# Patient Record
Sex: Male | Born: 1940 | Race: White | Hispanic: No | Marital: Married | Smoking: Former smoker
Health system: Southern US, Community
[De-identification: ages and names within clinical notes are randomized; demographics above are authoritative.]

## PROBLEM LIST (undated history)

## (undated) DIAGNOSIS — I10 Essential (primary) hypertension: Secondary | ICD-10-CM

## (undated) DIAGNOSIS — J8410 Pulmonary fibrosis, unspecified: Secondary | ICD-10-CM

## (undated) DIAGNOSIS — I2510 Atherosclerotic heart disease of native coronary artery without angina pectoris: Secondary | ICD-10-CM

## (undated) DIAGNOSIS — I272 Pulmonary hypertension, unspecified: Secondary | ICD-10-CM

## (undated) DIAGNOSIS — I951 Orthostatic hypotension: Secondary | ICD-10-CM

## (undated) HISTORY — PX: BACK SURGERY: SHX140

## (undated) HISTORY — DX: Atherosclerotic heart disease of native coronary artery without angina pectoris: I25.10

## (undated) HISTORY — PX: CORONARY ANGIOPLASTY WITH STENT PLACEMENT: SHX49

## (undated) HISTORY — DX: Orthostatic hypotension: I95.1

## (undated) HISTORY — DX: Pulmonary hypertension, unspecified: I27.20

## (undated) HISTORY — DX: Essential (primary) hypertension: I10

## (undated) HISTORY — PX: CARDIAC CATHETERIZATION: SHX172

## (undated) HISTORY — DX: Pulmonary fibrosis, unspecified: J84.10

---

## 2000-03-31 ENCOUNTER — Ambulatory Visit (HOSPITAL_COMMUNITY): Admission: RE | Admit: 2000-03-31 | Discharge: 2000-03-31 | Payer: Self-pay | Admitting: Family Medicine

## 2000-04-08 ENCOUNTER — Encounter (HOSPITAL_COMMUNITY): Admission: RE | Admit: 2000-04-08 | Discharge: 2000-07-07 | Payer: Self-pay | Admitting: Family Medicine

## 2000-04-22 ENCOUNTER — Encounter: Payer: Self-pay | Admitting: Family Medicine

## 2000-04-22 ENCOUNTER — Ambulatory Visit (HOSPITAL_COMMUNITY): Admission: RE | Admit: 2000-04-22 | Discharge: 2000-04-22 | Payer: Self-pay | Admitting: Family Medicine

## 2000-07-08 ENCOUNTER — Encounter (HOSPITAL_COMMUNITY): Admission: RE | Admit: 2000-07-08 | Discharge: 2000-10-06 | Payer: Self-pay | Admitting: Family Medicine

## 2002-09-05 ENCOUNTER — Ambulatory Visit (HOSPITAL_COMMUNITY): Admission: RE | Admit: 2002-09-05 | Discharge: 2002-09-05 | Payer: Self-pay | Admitting: Family Medicine

## 2008-02-20 ENCOUNTER — Inpatient Hospital Stay (HOSPITAL_COMMUNITY): Admission: EM | Admit: 2008-02-20 | Discharge: 2008-02-22 | Payer: Self-pay | Admitting: *Deleted

## 2009-11-03 IMAGING — CR DG CHEST 1V PORT
1 series · 1 of 1 positions shown · non-contrast
Comparison: None

CLINICAL DATA: Chest pain.

PORTABLE CHEST - 1 VIEW

[AP]
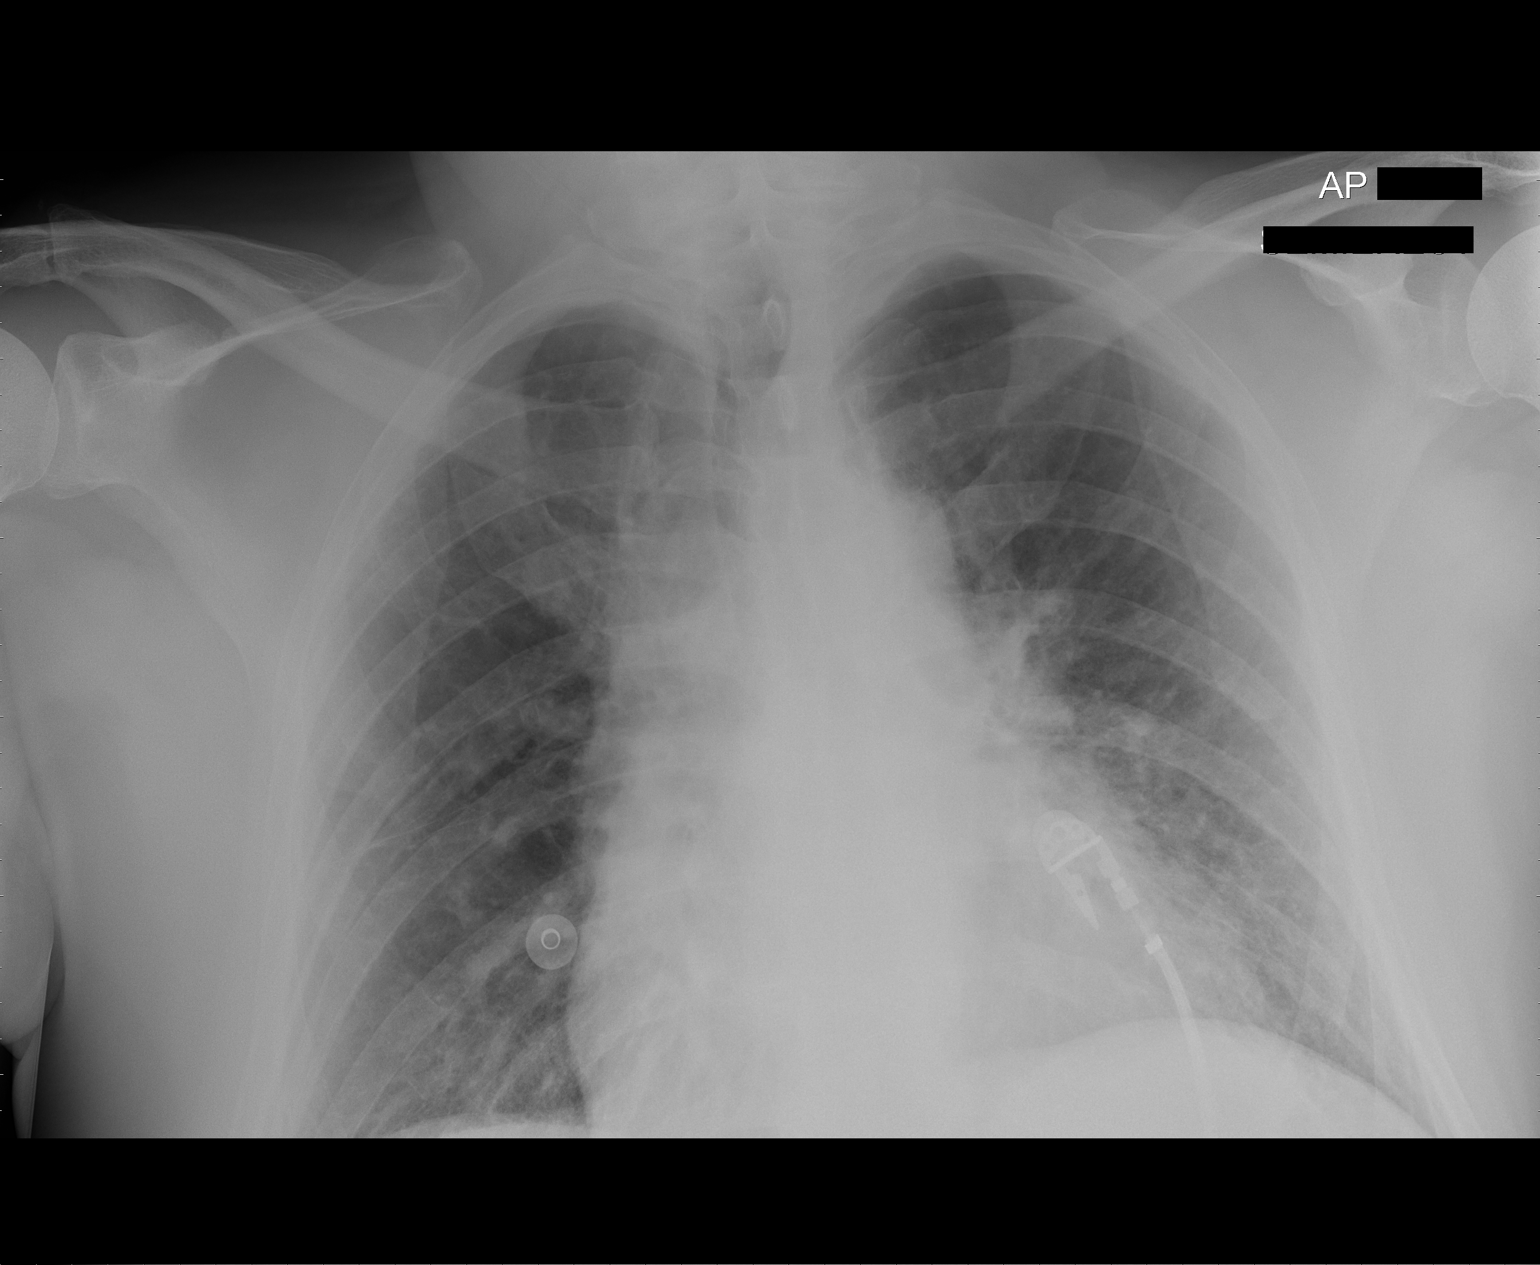

[1 of 1 positions shown; findings below may reference images not displayed]

FINDINGS: Coarsening of interstitial lung markings is seen in the
lung bases bilaterally.  This may be chronic in etiology, although
mild interstitial edema cannot definitely be excluded.  There is no
evidence of pulmonary consolidation or pleural effusion.  Heart
size is at the upper limits of normal.
IMPRESSION: Pulmonary interstitial prominence in the lower lung zones, which
may be due to chronic lung disease although mild interstitial edema
cannot definitely be excluded.

## 2010-01-01 ENCOUNTER — Encounter: Admission: RE | Admit: 2010-01-01 | Discharge: 2010-01-01 | Payer: Self-pay | Admitting: Family Medicine

## 2010-01-20 ENCOUNTER — Ambulatory Visit: Payer: Self-pay | Admitting: Cardiovascular Disease

## 2010-08-04 ENCOUNTER — Ambulatory Visit: Payer: Self-pay | Admitting: Cardiovascular Disease

## 2010-09-24 ENCOUNTER — Ambulatory Visit: Payer: Self-pay | Admitting: Cardiovascular Disease

## 2010-09-29 ENCOUNTER — Encounter: Payer: Self-pay | Admitting: Cardiovascular Disease

## 2010-09-29 DIAGNOSIS — I2510 Atherosclerotic heart disease of native coronary artery without angina pectoris: Secondary | ICD-10-CM | POA: Insufficient documentation

## 2010-09-29 DIAGNOSIS — I951 Orthostatic hypotension: Secondary | ICD-10-CM | POA: Insufficient documentation

## 2010-09-29 DIAGNOSIS — I10 Essential (primary) hypertension: Secondary | ICD-10-CM | POA: Insufficient documentation

## 2010-09-29 DIAGNOSIS — I272 Pulmonary hypertension, unspecified: Secondary | ICD-10-CM | POA: Insufficient documentation

## 2010-09-29 DIAGNOSIS — J8410 Pulmonary fibrosis, unspecified: Secondary | ICD-10-CM | POA: Insufficient documentation

## 2010-10-01 ENCOUNTER — Ambulatory Visit (INDEPENDENT_AMBULATORY_CARE_PROVIDER_SITE_OTHER): Payer: Medicare Other | Admitting: Cardiovascular Disease

## 2010-10-01 ENCOUNTER — Encounter: Payer: Self-pay | Admitting: Cardiovascular Disease

## 2010-10-01 VITALS — BP 138/70 | HR 56 | Ht 71.0 in | Wt 215.4 lb

## 2010-10-01 DIAGNOSIS — E7800 Pure hypercholesterolemia, unspecified: Secondary | ICD-10-CM | POA: Insufficient documentation

## 2010-10-01 DIAGNOSIS — E785 Hyperlipidemia, unspecified: Secondary | ICD-10-CM

## 2010-10-01 DIAGNOSIS — I2510 Atherosclerotic heart disease of native coronary artery without angina pectoris: Secondary | ICD-10-CM

## 2010-10-01 MED ORDER — ROSUVASTATIN CALCIUM 10 MG PO TABS: 10.0000 mg | ORAL_TABLET | Freq: Every day | ORAL | Status: DC

## 2010-10-01 NOTE — Assessment & Plan Note (Signed)
Cody Olson LDL has been little high in the past. We'll go ahead and start him on Crestor 10 mg a day. I've given him a prescription and also a voucher to help with the cost. We will schedule him to have a lipid profile, basic metabolic profile, and hepatic profile in 3 months. Alternatively, he can check these labs at his medical doctor and send [place] the results.  I'll see him again in 6 months.

## 2010-10-01 NOTE — Progress Notes (Signed)
Cody Olson Date of Birth  [birth year] [facility] [address]     [address] [place], [place]  [address] [phone]  Fax  [phone]  History of Present Illness:  Cody Olson is an elderly man with a history of coronary artery disease. He status post PTCA and stenting of his right coronary artery.  He also has a history of hypertension and pulmonary hypertension. He also has Pulmonary fibrosis diagnosed on CT scan.  He's not any episodes of chest pain or shortness of breath.  Current Outpatient Prescriptions on File Prior to Visit  Medication Sig Dispense Refill  . ALPRAZolam (XANAX) 0.5 MG tablet Take 0.5 mg by mouth at bedtime as needed.        Marland Kitchen amLODipine (NORVASC) 5 MG tablet Take 5 mg by mouth daily.        Marland Kitchen aspirin 81 MG tablet Take 81 mg by mouth daily.       . benazepril (LOTENSIN) 20 MG tablet Take 20 mg by mouth daily.        Marland Kitchen buPROPion (WELLBUTRIN SR) 150 MG 12 hr tablet Take 75 mg by mouth 2 (two) times daily.        . clopidogrel (PLAVIX) 75 MG tablet Take 75 mg by mouth daily.        . diclofenac (VOLTAREN) 75 MG EC tablet Take 75 mg by mouth daily.       Marland Kitchen doxazosin (CARDURA) 8 MG tablet Take 8 mg by mouth at bedtime.        . hydrochlorothiazide 25 MG tablet Take 12.5 mg by mouth daily.        . nitroGLYCERIN (NITROSTAT) 0.4 MG SL tablet Place 0.4 mg under the tongue every 5 (five) minutes as needed.        . pantoprazole (PROTONIX) 40 MG tablet Take 40 mg by mouth 2 (two) times daily.        . potassium chloride (KLOR-CON) 10 MEQ CR tablet Take 10 mEq by mouth daily.          No Known Allergies  Past Medical History  Diagnosis Date  . Coronary artery disease     stent 1996, re expansion in 2009  . Hypertension   . Pulmonary hypertension     PA pressure 41 mm Hg  . Pulmonary fibrosis   . Orthostatic hypotension     No past surgical history on file.  History  Smoking status  . Former Smoker  . Quit  date: 06/14/1994  Smokeless tobacco  . Not on file    History  Alcohol Use No    No family history on file.  Reviw of Systems:  Reviewed in the HPI.  All other systems are negative.  Physical Exam: BP 138/70  Pulse 56  Ht 5\' 11"  (1.803 m)  Wt 215 lb 6.4 oz (97.705 kg)  BMI 30.04 kg/m2 The patient is alert and oriented x 3.  The mood and affect are normal.  The skin is warm and dry.  Color is normal.  The HEENT exam reveals that the sclera are nonicteric.  The mucous membranes are moist.  The carotids are 2+ without bruits.  There is no thyromegaly.  There is no JVD.  The lungs are clear.  The chest wall is non tender.  The heart exam reveals a regular rate with a normal S1 and S2.  There is a soft systolic murmur.  The PMI is not displaced.  Abdominal exam reveals good bowel sounds.  There is no guarding or rebound.  There is no hepatosplenomegaly or tenderness.  There are no masses.  Exam of the legs reveal no clubbing, cyanosis, or edema.  The legs are without rashes.  The distal pulses are intact.  Cranial nerves II - XII are intact.  Motor and sensory functions are intact.  The gait is normal.  Assessment / Plan:

## 2010-10-01 NOTE — Assessment & Plan Note (Signed)
He remains asymptomatic.  We will continue with his same medications.

## 2010-10-05 ENCOUNTER — Telehealth: Payer: Self-pay | Admitting: Cardiovascular Disease

## 2010-10-05 NOTE — Telephone Encounter (Signed)
Wanted generic prescription of Lipitor called in to the [facility] in [place] [id number]. I have pulled the chart.

## 2010-10-06 ENCOUNTER — Other Ambulatory Visit: Payer: Self-pay | Admitting: *Deleted

## 2010-10-06 DIAGNOSIS — E785 Hyperlipidemia, unspecified: Secondary | ICD-10-CM

## 2010-10-06 MED ORDER — ATORVASTATIN CALCIUM 40 MG PO TABS: 40.0000 mg | ORAL_TABLET | Freq: Every day | ORAL | Status: DC

## 2010-10-06 NOTE — Telephone Encounter (Signed)
crestor too expensive, request change, lipitor 40mg  escribed, per dr Izell [place] RN

## 2010-10-27 NOTE — H&P (Signed)
NAME:  Cody, Olson NO.:  [id number]   MEDICAL RECORD NO.:  [id number]          PATIENT TYPE:  EMS   LOCATION:  MAJO                         FACILITY:  [facility]   PHYSICIAN:  Vesta Mixer, M.D. DATE OF BIRTH:  Sep 10, 1940   DATE OF ADMISSION:  02/20/2008  DATE OF DISCHARGE:                              HISTORY & PHYSICAL   Cody Olson is a middle-aged gentleman with a history of coronary  artery disease.  He was admitted to the hospital after having episodes  of chest pain.   Cody Olson has a history of CAD.  He is status post PTCA and stenting  of his right coronary artery in 1996.  He has overall done fairly well.  He was scheduled to have a stress test later this year.  This morning,  he awoke with severe upper chest pain.  It was described as a substernal  burning-like sensation.  It lasted about 10-15 minutes.  It was  associated with a profuse diaphoresis as well as bradycardia.  He took a  Nexium, the pain resolved for a few minutes but then returned.  It  eventually resolved on its own.  The patient was brought to the hospital  for further evaluation.   He is not taking any nitroglycerin.   The patient has been relatively active and has not had any other  episodes of chest discomfort.   CURRENT MEDICATIONS:  1. Amlodipine/benazepril 5 mg/20 mg once a day.  2. Bupropion 75 mg tablets - 2 tablets a day.  3. Hydrochlorothiazide 25 mg tablets - one-half tablet a day.  4. Nexium once a day.  5. Cardura 8 mg a day.  6. Xanax 0.5 mg as needed.  7. Aspirin 81 mg a day.  8. Nitroglycerin as needed.   ALLERGIES:  He has no known drug allergies.   PAST MEDICAL HISTORY:  Coronary artery disease.  He is status post PTCA  and stenting of his right coronary artery disease.   SOCIAL HISTORY:  The patient has a history of smoking in the past, but  quit in 1996.   FAMILY HISTORY:  Noncontributory.   REVIEW OF SYSTEMS:  Reviewed and is essentially  negative except as noted  in the HPI.   PHYSICAL EXAMINATION:  GENERAL:  He is a middle-aged gentleman in no  acute distress.  He is alert and oriented x3 and his mood and affect are  normal.  VITAL SIGNS:  His blood pressure is 143/70 with heart rate 60.  HEENT:  Carotids 2+.  He has no bruits, no JVD, and no thyromegaly.  LUNGS:  Clear.  HEART:  Regular rate.  S1 and S2.  ABDOMEN:  Good bowel sounds and is nontender.  EXTREMITIES:  There is no clubbing, cyanosis, or edema.  NEUROLOGIC:  Nonfocal.   His EKG reveals sinus bradycardia.  He has old inferior Q-waves with a  slight amount of ST-segment elevation in the inferior leads.  The EKG is  essentially unchanged from his previous EKGs in 2003.   His point-of-care enzymes are negative thus far.   IMPRESSION AND PLAN:  Chest pain.  The patient's symptoms are very  worrisome for ischemia.  He had profuse diaphoresis as well as  bradycardia.  My recommendation will be to admit him and to schedule  heart catheterization this afternoon.  Other possibilities and possible  etiologies include gastroesophageal reflux pain or musculoskeletal pain.  We will admit him and withdraw further cardiac enzymes.  We will  anticipate doing a heart catheterization today.  We have discussed the  risks, benefits, and options regarding heart catheterization.  He  understands and agrees to proceed.      Vesta Mixer, M.D.  Electronically Signed     PJN/MEDQ  D:  02/20/2008  T:  02/20/2008  Job:  [id number]

## 2010-10-27 NOTE — Cardiovascular Report (Signed)
NAME:  [name], [name] NO.:  [id number]   MEDICAL RECORD NO.:  [id number]          PATIENT TYPE:  INP   LOCATION:  6525                         FACILITY:  [facility]   PHYSICIAN:  Vesta Mixer, M.D. DATE OF BIRTH:  01/08/1941   DATE OF PROCEDURE:  02/21/2008  DATE OF DISCHARGE:                            CARDIAC CATHETERIZATION   Cody Olson is a 70 year old gentleman who was admitted yesterday  with episodes of chest pain.  He had a heart catheterization which  revealed a tight 80-90% proximal right coronary artery stenosis.  The  right coronary artery was relatively small and nondominant and was  thought that perhaps it would not be hemodynamically significant.  His  other vessels only had mild-to-moderate irregularities.   Overnight he bumped his cardiac enzymes and we brought him back to the  lab today for PCI of this vessel.   The procedure was PTCA and cutting balloon procedures to the proximal  right coronary artery.   The right femoral artery was easily cannulated using a modified  Seldinger technique.  Angiomax was given.  The ACT was 336.  A 6-French  Judkins right 4 side-hole guide was positioned into the ostium of the  small nondominant right coronary artery.  The angiogram revealed a  proximal RCA stent.  There was 80-90% in-stent restenosis at the distal  aspect of this stent.   A Prowater guidewire was placed down into the distal right coronary  artery down to one of the RV marginal branches.  A 2.0 x 15-mm apex  balloon was positioned in the proximal vessel for predilatation.  It was  inflated up to 4 atmospheres for 30 seconds.   At this point, a 2.5 x 10-mm cutting balloon was positioned in the right  coronary artery.  It was inflated a total of five times up to a peak  pressure of 8 atmospheres.  Each inflation was approximately 20 seconds.  This gave [place] a very nice angiographic result.  There was a residual  stenosis of about 10%.  The  original size of the stent was 3.0 mm, but  he clearly has a lot of plaque and smooth muscle proliferation within  the stent.   The patient tolerated the procedure quite well.   COMPLICATIONS:  None.   CONCLUSION:  Successful PCI of the proximal RCA in-stent restenosis.  The stenosis was reduced from 80-90% down to 10%.      Vesta Mixer, M.D.  Electronically Signed     PJN/MEDQ  D:  02/21/2008  T:  02/22/2008  Job:  [id number]

## 2010-10-27 NOTE — Cardiovascular Report (Signed)
NAME:  Cody Olson, Cody Olson NO.:  [id number]   MEDICAL RECORD NO.:  [id number]          PATIENT TYPE:  INP   LOCATION:  6525                         FACILITY:  [facility]   PHYSICIAN:  Vesta Mixer, M.D. DATE OF BIRTH:  07-Mar-1941   DATE OF PROCEDURE:  02/20/2008  DATE OF DISCHARGE:                            CARDIAC CATHETERIZATION   Cody Olson is a 70 year old gentleman with a history of coronary  artery disease.  He is status post PTCA and stenting of a relatively  small and nondominant right coronary artery back approximately 10 years  ago.  He presented this morning with some episodes of atypical chest  pain.  He did have some EKG abnormalities, which although were not all  that different from his previous EKG 6 years ago were somewhat  concerning nevertheless.  We decided to proceed with heart  catheterization for further evaluation.   The procedure was left heart catheterization with coronary angiography.  The right femoral artery was easily cannulated using a modified  Seldinger technique.   HEMODYNAMICS:  LV pressure was 151/11 with an aortic pressure of 149/72.   Angiography of left main.  The left main is fairly normal.   The left anterior descending artery is moderate in size.  There was a  minor luminal irregularities in the proximal mid segments between 10%  and 20%.  The LAD gives off several moderate sized diagonal branches,  which are normal.   The left circumflex artery is large and dominant.  The first obtuse  marginal artery is a very high marginal and is fairly small in size.  The second obtuse marginal artery is an extremely large branch.  There  are some minor luminal irregularities in the second OM.   The circumflex continues around and gives off a small posterolateral  branches and then a small, but relatively normal posterior descending  branch.   The right coronary artery is relatively small and nondominant.  There is  a stent in  the proximal RCA.  There is an 80%-90% stenosis within the  stent.  There is good flow down the vessel, but this RCA feeds only a  few RV marginal branches.  There are some vessels that appear to be  collateralizing to the left side.   The left ventriculogram was performed in a 30 RAO position.  It reveals  normal left ventricular systolic function.  The ejection fraction is 65%-  70%.   COMPLICATIONS:  None.   CONCLUSION:  1. Single-vessel coronary artery disease involving the right coronary      artery.  2. Normal left ventricular systolic function.   The patient's right coronary artery is relatively small.  We originally  placed a 3 mm x 15 mm Palmaz-Schatz stent.  The vessel is now only 2 mm  in size and I do not think that we will be able to get it much bigger  than that.  The vessel would have a very high risk of restenosis and  closure.  For now, we will add Plavix and other medications.  We will try to treat him medically.  His  enzymes are negative, so I do  not think that his episodes of chest pain were necessarily cardiac.  We  will continue to follow him, do angioplasty only if he has more pain or  develop some occlusion of this vessel.  He is currently stable leaving  the lab.      Vesta Mixer, M.D.  Electronically Signed     PJN/MEDQ  D:  02/20/2008  T:  02/21/2008  Job:  [id number]

## 2010-10-27 NOTE — Discharge Summary (Signed)
NAME:  Cody Olson, Cody Olson             ACCOUNT NO.:  [id number]   MEDICAL RECORD NO.:  [id number]          PATIENT TYPE:  INP   LOCATION:  6525                         FACILITY:  [facility]   PHYSICIAN:  Vesta Mixer, M.D. DATE OF BIRTH:  July 13, 1940   DATE OF ADMISSION:  02/20/2008  DATE OF DISCHARGE:  02/22/2008                               DISCHARGE SUMMARY   DISCHARGE DIAGNOSES:  1. Coronary artery disease - status post percutaneous transluminal      coronary angioplasty of his right coronary artery stent.  2. Hypertension.   DISCHARGE MEDICATIONS:  1. Aspirin 81 mg a day.  2. Plavix 75 mg a day.  3. Lotrel 5 mg/20 mg once a day.  4. Cardura 8 mg a day.  5. Hydrochlorothiazide 25 mg tablets - one-half tablet daily.  6. Nitroglycerin 0.4 mg as needed.  7. Xanax as needed.  8. Bupropion 75 mg - 2 tablets a day.  9. Nexium 40 mg a day as needed.  10.Potassium chloride 10 mEq a day.   DISPOSITION:  The patient will see Dr. Elease Hashimoto in 1-2 weeks for followup  visit.  He is to see his medical doctor for further issues.   HISTORY:  Cody Olson is a 70 year old gentleman who presented to the  emergency room on February 20, 2008, with some episodes of chest pain.  His initial cardiac enzymes were negative.  He is admitted for further  evaluation.  Please see dictated H&P for further details.   HOSPITAL COURSE PER PROBLEMS:  Chest pain.  The patient was admitted to  hospital.  He had a heart catheterization later that day.  He was found  to have a tight stenosis in a very small and nondominant right coronary  artery.  It was thought that perhaps this lesion could be treated  medically.  We kept him overnight, one additional night, just to collect  cardiac enzymes and cardiac enzymes came back abnormal with a troponin  to 0.31.  Given that information, we proceeded with angioplasty using a  cutting balloon to the previously placed RCA stent.  We obtained a very  nice result and reduced  the 80-90% RCA stenosis down to 10%.  The  patient did not have any further episodes of chest pain and did not have  any complications.   The left circumflex artery and the left anterior descending artery have  mild luminal irregularities, but no critical stenosis.   The patient will be discharged on the above-noted medications and  disposition.  The right coronary artery does supply his SA node and some  RV marginal branches.  His heart rate increased some after we open up  the RCA.  We will see him back in the office in a week or two for  followup visit.      Vesta Mixer, M.D.  Electronically Signed     PJN/MEDQ  D:  02/22/2008  T:  02/22/2008  Job:  [id number]   cc:   [facility]

## 2011-03-17 LAB — BASIC METABOLIC PANEL
BUN: 14
BUN: 7
CO2: 26
Calcium: 8.6
Chloride: 107
Creatinine, Ser: 1.12
Creatinine, Ser: 1.14
GFR calc non Af Amer: >60
Potassium: 3.4 — ABNORMAL LOW

## 2011-03-17 LAB — CARDIAC PANEL(CRET KIN+CKTOT+MB+TROPI)
CK, MB: 2.6
Relative Index: 2.7 — ABNORMAL HIGH
Relative Index: 3.8 — ABNORMAL HIGH
Relative Index: INVALID
Total CK: 97
Troponin I: 0.28 — ABNORMAL HIGH
Troponin I: 0.35 — ABNORMAL HIGH

## 2011-03-17 LAB — CBC
HCT: 40
Hemoglobin: 14
MCHC: 34.6
MCV: 95.7
MCV: 97.2
Platelets: 210
RDW: 12.4
WBC: 9.2

## 2011-03-17 LAB — POCT I-STAT, CHEM 8
Calcium, Ion: 1.13
Creatinine, Ser: 1.3
Hemoglobin: 13.3
Sodium: 140
TCO2: 25

## 2011-03-17 LAB — POCT CARDIAC MARKERS
CKMB, poc: <1 — ABNORMAL LOW
Troponin i, poc: <0.05

## 2011-10-25 ENCOUNTER — Ambulatory Visit: Payer: Medicare Other | Admitting: Cardiovascular Disease

## 2012-01-24 ENCOUNTER — Encounter: Payer: Self-pay | Admitting: *Deleted

## 2012-01-25 ENCOUNTER — Encounter: Payer: Self-pay | Admitting: Cardiovascular Disease

## 2012-01-25 ENCOUNTER — Ambulatory Visit (INDEPENDENT_AMBULATORY_CARE_PROVIDER_SITE_OTHER): Payer: Medicare Other | Admitting: Cardiovascular Disease

## 2012-01-25 VITALS — BP 135/71 | HR 51 | Ht 71.0 in | Wt 210.1 lb

## 2012-01-25 DIAGNOSIS — I2510 Atherosclerotic heart disease of native coronary artery without angina pectoris: Secondary | ICD-10-CM

## 2012-01-25 NOTE — Progress Notes (Signed)
Cody Olson Date of Birth  Feb 01, 1941       [facility]    [place] [address], [address]  [address] [place], [place]  [address]   [place], [place]  [address] [phone]     [phone]   Fax  [phone]    Fax [phone]  Problem List: 1. Coronary artery disease-status post PTCA and stenting of his right coronary artery 2. Hypertension 3. Pulmonary hypertension 4. Pulmonary fibrosis on CT scan 5. Hyperlipidemia 6. Hemochromatosis  History of Present Illness: Cody Olson is an elderly man with a history of coronary artery disease. He status post PTCA and stenting of his right coronary artery. He also has a history of hypertension and pulmonary hypertension. He also has Pulmonary fibrosis diagnosed on CT scan.  He's done well since I last saw him. He's not had any episodes of chest pain or shortness of breath. He has remained very active.    Current Outpatient Prescriptions on File Prior to Visit  Medication Sig Dispense Refill  . ALPRAZolam (XANAX) 0.5 MG tablet Take 0.5 mg by mouth at bedtime as needed.        Marland Kitchen amLODipine (NORVASC) 5 MG tablet Take 5 mg by mouth daily.        Marland Kitchen aspirin 81 MG tablet Take 81 mg by mouth daily.       . benazepril (LOTENSIN) 20 MG tablet Take 20 mg by mouth daily.        Marland Kitchen buPROPion (WELLBUTRIN SR) 150 MG 12 hr tablet Take 75 mg by mouth 2 (two) times daily.        . clopidogrel (PLAVIX) 75 MG tablet Take 75 mg by mouth daily.        . diclofenac (VOLTAREN) 75 MG EC tablet Take 75 mg by mouth 2 (two) times daily.       Marland Kitchen doxazosin (CARDURA) 8 MG tablet Take 8 mg by mouth at bedtime.        . hydrochlorothiazide 25 MG tablet Take 12.5 mg by mouth daily.        . nitroGLYCERIN (NITROSTAT) 0.4 MG SL tablet Place 0.4 mg under the tongue every 5 (five) minutes as needed.        . pantoprazole (PROTONIX) 40 MG tablet Take 40 mg by mouth 2 (two) times daily.        . potassium chloride (KLOR-CON) 10  MEQ CR tablet Take 10 mEq by mouth daily.        Marland Kitchen atorvastatin (LIPITOR) 40 MG tablet Take 1 tablet (40 mg total) by mouth daily.  30 tablet  11    No Known Allergies  Past Medical History  Diagnosis Date  . Coronary artery disease     stent 1996, re expansion in 2009  . Hypertension   . Pulmonary hypertension     PA pressure 41 mm Hg  . Pulmonary fibrosis   . Orthostatic hypotension     Past Surgical History  Procedure Date  . Coronary angioplasty with stent placement     stenting of RCA  . Cardiac catheterization 02/19/2010    Est. EF of 65-70% Single-vessel coronary artery disease involving the RCA -- Normal LV systolic function  . Coronary angioplasty with stent placement 02/20/2010    Successful PCI of the proximal RCA in-stent restenosis -- The stenosis was reduced from 80-90% down to 10%    History  Smoking status  . Former Smoker  . Quit  date: 06/14/1994  Smokeless tobacco  . Not on file    History  Alcohol Use No    No family history on file.  Reviw of Systems:  Reviewed in the HPI.  All other systems are negative.  Physical Exam: Blood pressure 135/71, pulse 51, height 5\' 11"  (1.803 m), weight 210 lb 1.9 oz (95.31 kg). General: Well developed, well nourished, in no acute distress.  Head: Normocephalic, atraumatic, sclera non-icteric, mucus membranes are moist,   Neck: Supple. Carotids are 2 + without bruits. No JVD  Lungs: few basilar rales in the bases.  Heart: regular rate.  normal  S1 S2.  There is a 2/6 systolic murmur at the LSB.  Abdomen: Soft, non-tender, non-distended with normal bowel sounds. No hepatomegaly. No rebound/guarding. No masses.  Msk:  Strength and tone are normal  Extremities: No clubbing or cyanosis. No edema.  Distal pedal pulses are 2+ and equal bilaterally.  Neuro: Alert and oriented X 3. Moves all extremities spontaneously.  Psych:  Responds to questions appropriately with a normal affect.  ECG: 01/25/2012-sinus  bradycardia at 51 beats a minute. He has moderate voltage criteria for left ventricular hypertrophy  Assessment / Plan:

## 2012-01-25 NOTE — Assessment & Plan Note (Signed)
He's done well. His not having any episodes of chest pain.

## 2012-01-25 NOTE — Patient Instructions (Addendum)
Your physician wants you to follow-up in: 1 year  You will receive a reminder letter in the mail two months in advance. If you don't receive a letter, please call our office to schedule the follow-up appointment.   Your physician recommends that you return for a FASTING lipid profile: 1 year   

## 2012-03-07 ENCOUNTER — Institutional Professional Consult (permissible substitution): Payer: Medicare Other | Admitting: Internal Medicine

## 2012-03-17 ENCOUNTER — Encounter: Payer: Self-pay | Admitting: Pulmonary Disease

## 2012-03-17 ENCOUNTER — Ambulatory Visit (INDEPENDENT_AMBULATORY_CARE_PROVIDER_SITE_OTHER): Payer: Medicare Other | Admitting: Pulmonary Disease

## 2012-03-17 ENCOUNTER — Ambulatory Visit (INDEPENDENT_AMBULATORY_CARE_PROVIDER_SITE_OTHER)
Admission: RE | Admit: 2012-03-17 | Discharge: 2012-03-17 | Disposition: A | Discharge status: Home / Self Care | Payer: Medicare Other | Source: Ambulatory Visit | Attending: Pulmonary Disease | Admitting: Pulmonary Disease

## 2012-03-17 VITALS — BP 118/64 | HR 53

## 2012-03-17 DIAGNOSIS — J8410 Pulmonary fibrosis, unspecified: Secondary | ICD-10-CM

## 2012-03-17 NOTE — Progress Notes (Signed)
Subjective:    Patient ID: Cody Olson, male    DOB: [birth year], 71 y.o.   MRN: [id number]  HPI PCP - fred Andrey Campanile   71 y.o ex-smoker, quit '96 presents for evaluation of cough since 7/13. His wife is a Engineer, civil (consulting) & provides part of the history. He is  status post PTCA and stenting of his right coronary artery. He also has a history of hypertension and pulmonary hypertension.  Pulmonary fibrosis was diagnosed on CT scan 7/11 that showed peripheral densities in the lungs bilaterally compatible with early fibrosis/honeycombing. He reports cough & LRI symptoms in 7/13, initially given z-pak , then augmentin & a 6 day prednisone course for persistent symptoms. He feels that the cough is emanating from his throat, minimal clear phlegm worsens with eating, better with prednisone, sleep, robitussin & cough drops. Benazepril was stopped for 10 days, but he felt bad with losartan & hence benazepril restarted.  CXR 8/1  He was diagnosed with hemachromatosis in 2005 & required phlebotomy once, recent ferritin was nml. Pneumovax 2009 He worked as a Designer, industrial/product prior to retirement, smoked 2 PPD x 25 y before quitting in '96 CT chest 10/13 showed peripheral interstitial prominence and scattered ground-glass  opacities compatible with early fibrosis, most pronounced in the mid and lower lung zones, but also involving the upper lung zones O2 satn dropped to 90% on walking & recovered on resting Pulmonary htn mentioned in problem list, no echo report available    Past Medical History  Diagnosis Date  . Coronary artery disease     stent 1996, re expansion in 2009  . Hypertension   . Pulmonary hypertension     PA pressure 41 mm Hg  . Pulmonary fibrosis   . Orthostatic hypotension      Past Surgical History  Procedure Date  . Coronary angioplasty with stent placement     stenting of RCA  . Cardiac catheterization 02/19/2010    Est. EF of 65-70% Single-vessel coronary artery disease involving  the RCA -- Normal LV systolic function  . Coronary angioplasty with stent placement 02/20/2010    Successful PCI of the proximal RCA in-stent restenosis -- The stenosis was reduced from 80-90% down to 10%  . Back surgery     x 3   No Known Allergies  History   Social History  . Marital Status: Married    Spouse Name: N/A    Number of Children: N/A  . Years of Education: N/A   Occupational History  . Not on file.   Social History Main Topics  . Smoking status: Former Smoker -- 2.0 packs/day for 25 years    Types: Cigarettes    Quit date: 06/03/1995  . Smokeless tobacco: Not on file  . Alcohol Use: No  . Drug Use: No  . Sexually Active: Not on file   Other Topics Concern  . Not on file   Social History Narrative  . No narrative on file      Review of Systems  Constitutional: Negative for fever, appetite change and unexpected weight change.  HENT: Negative for ear pain, congestion, sore throat, sneezing, trouble swallowing and dental problem.   Respiratory: Positive for cough. Negative for shortness of breath.   Cardiovascular: Negative for chest pain, palpitations and leg swelling.  Gastrointestinal: Negative for abdominal pain.  Musculoskeletal: Negative for joint swelling.  Skin: Negative for rash.  Neurological: Negative for headaches.  Psychiatric/Behavioral: Negative for dysphoric mood. The patient is not nervous/anxious.  Objective:   Physical Exam  Gen. Pleasant, well-nourished, in no distress, normal affect ENT - no lesions, no post nasal drip Neck: No JVD, no thyromegaly, no carotid bruits Lungs: no use of accessory muscles, no dullness to percussion, bibasal rales, no rhonchi  Cardiovascular: Rhythm regular, heart sounds  normal, no murmurs or gallops, no peripheral edema Abdomen: soft and non-tender, no hepatosplenomegaly, BS normal. Musculoskeletal: No deformities, no cyanosis or clubbing Neuro:  alert, non focal        Assessment &  Plan:

## 2012-03-17 NOTE — Patient Instructions (Addendum)
Stop benazepril Take Benicar 40 mg - 1/2 tab instead - call us for side effects Ambulatory satn Ct scan of lungs for fibrosis & breathing test prior to next visit Take DELSYM 2 tsp thrice daily as needed for cough

## 2012-03-20 ENCOUNTER — Telehealth: Payer: Self-pay | Admitting: Pulmonary Disease

## 2012-03-20 NOTE — Telephone Encounter (Signed)
Called and spoke with patients wife, she stated that Dr. Vassie Loll "promised" her a phone call today with results on CT scan.  I informed patient that Dr. Vassie Loll was not in office today nor tomorrow and at this time it did not appear that he had resulted the CT scan.  Patient verbalized understanding of this, I told her we would call her as soon as these results were available for her.  Nothing further needed at this time.  Will send to [facility] for follow up.

## 2012-03-21 NOTE — Telephone Encounter (Signed)
CT shows fibrosis / scar tissue- slight increased from last scan Await breathing test results- will discuss on next visit

## 2012-03-21 NOTE — Telephone Encounter (Signed)
lmomtcb x1 for pt 

## 2012-03-22 NOTE — Telephone Encounter (Signed)
Spoke with Lew Dawes and she is aware of results and will let patient know; also PFT to be done on 04-19-12 at 9am and will follow up with RA at 10am;results will be given that day.

## 2012-03-22 NOTE — Assessment & Plan Note (Signed)
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

## 2012-04-03 ENCOUNTER — Institutional Professional Consult (permissible substitution): Payer: Medicare Other | Admitting: Pulmonary Disease

## 2012-04-19 ENCOUNTER — Ambulatory Visit: Payer: Medicare Other | Admitting: Pulmonary Disease

## 2012-05-02 ENCOUNTER — Ambulatory Visit (INDEPENDENT_AMBULATORY_CARE_PROVIDER_SITE_OTHER): Payer: Medicare Other | Admitting: Pulmonary Disease

## 2012-05-02 DIAGNOSIS — J8410 Pulmonary fibrosis, unspecified: Secondary | ICD-10-CM

## 2012-05-02 DIAGNOSIS — I272 Pulmonary hypertension, unspecified: Secondary | ICD-10-CM

## 2012-05-02 DIAGNOSIS — I2789 Other specified pulmonary heart diseases: Secondary | ICD-10-CM

## 2012-05-02 LAB — PULMONARY FUNCTION TEST

## 2012-05-02 NOTE — Progress Notes (Signed)
PFT done today. 

## 2012-05-10 ENCOUNTER — Telehealth: Payer: Self-pay | Admitting: Pulmonary Disease

## 2012-05-10 MED ORDER — OLMESARTAN MEDOXOMIL 20 MG PO TABS: 20.0000 mg | ORAL_TABLET | Freq: Every day | ORAL | Status: DC

## 2012-05-10 NOTE — Telephone Encounter (Signed)
I spoke with the pt spouse and she states they did get a call with CT results. She states she just wanted to see if the pt could be seen sooner then 06-01-12. I advised Dr. Vassie Loll did not have any openings but I could send him a message to see if they could be worked in. She states she would rather keep appt they have. She states the pt is not any worse, he is just still having a lot of cough. She states she will call to see if there are any cancellations. She did ask that we leave some samples at front of benicar for the pt, RA started him on this at last OV. Pt was given Benicar 40mg  and advised to take half tablet, but we only had 20mg  samples. Pt is aware to take a whole tablet of the sample meds. ,Carron Curie, CMA

## 2012-05-17 ENCOUNTER — Encounter: Payer: Self-pay | Admitting: Pulmonary Disease

## 2012-05-17 ENCOUNTER — Other Ambulatory Visit (INDEPENDENT_AMBULATORY_CARE_PROVIDER_SITE_OTHER): Payer: Medicare Other

## 2012-05-17 ENCOUNTER — Ambulatory Visit (INDEPENDENT_AMBULATORY_CARE_PROVIDER_SITE_OTHER): Payer: Medicare Other | Admitting: Pulmonary Disease

## 2012-05-17 VITALS — BP 152/70 | HR 55 | Temp 96.8°F | Ht 72.0 in | Wt 220.0 lb

## 2012-05-17 DIAGNOSIS — J8410 Pulmonary fibrosis, unspecified: Secondary | ICD-10-CM

## 2012-05-17 DIAGNOSIS — I10 Essential (primary) hypertension: Secondary | ICD-10-CM

## 2012-05-17 MED ORDER — OLMESARTAN MEDOXOMIL 20 MG PO TABS: 20.0000 mg | ORAL_TABLET | Freq: Every day | ORAL | Status: DC

## 2012-05-17 MED ORDER — TIOTROPIUM BROMIDE MONOHYDRATE 18 MCG IN CAPS: 18.0000 ug | ORAL_CAPSULE | Freq: Every day | RESPIRATORY_TRACT | Status: DC

## 2012-05-17 NOTE — Progress Notes (Signed)
  Subjective:    Patient ID: Cody Olson, male    DOB: 05/05/1941, 70 y.o.   MRN: [id number]  HPI PCP - fred Andrey Campanile   71 y.o ex-smoker, quit '96 presents for fu of ILD & cough since 7/13. His wife is a Engineer, civil (consulting) & provides part of the history.  He is status post PTCA and stenting of his right coronary artery. He also has a history of hypertension and pulmonary hypertension. Pulmonary fibrosis was diagnosed on CT scan 7/11 that showed peripheral densities in the lungs bilaterally compatible with early fibrosis/honeycombing.  He reports cough & LRI symptoms in 7/13, initially given z-pak , then augmentin & a 6 day prednisone course for persistent symptoms. He feels that the cough is emanating from his throat, minimal clear phlegm worsens with eating, better with prednisone, sleep, robitussin & cough drops.  Benazepril was stopped for 10 days, but he felt bad with losartan & hence benazepril restarted.  CXR 8/1  He was diagnosed with hemachromatosis in 2005 & required phlebotomy once, recent ferritin was nml.  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 breathing has been fine. still coughing up white phlem but is better. no wheezing, chest tx PFTs - fvc 66%, fev1 82%, ratio nml, TLC 58%, DLCO 47% Reviewed imaging in detail Cough 90% improved Bp controlled on benicar 20 although high today  Past Medical History  Diagnosis Date  . Coronary artery disease     stent 1996, re expansion in 2009  . Hypertension   . Pulmonary hypertension     PA  pressure 41 mm Hg  . Pulmonary fibrosis   . Orthostatic hypotension      Review of Systems neg for any significant sore throat, dysphagia, itching, sneezing, nasal congestion or excess/ purulent secretions, fever, chills, sweats, unintended wt loss, pleuritic or exertional cp, hempoptysis, orthopnea pnd or change in chronic leg swelling. Also denies presyncope, palpitations, heartburn, abdominal pain, nausea, vomiting, diarrhea or change in bowel or urinary habits, dysuria,hematuria, rash, arthralgias, visual complaints, headache, numbness weakness or ataxia.     Objective:   Physical Exam        Assessment & Plan:

## 2012-05-17 NOTE — Assessment & Plan Note (Signed)
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 He does not want to pursue biopsy Pfts s/o moderate intraparenchymal restriction Discussed role of transplant  Symptomatic Rx if cough recurs

## 2012-05-17 NOTE — Assessment & Plan Note (Signed)
Ct benicar 20 - refills given

## 2012-05-17 NOTE — Patient Instructions (Addendum)
You have pulmonary  Fibrosis Blood owrk today - RA factor, ESR, ANA, hypersens panel Rx for benicar 20 x 1 month x 3 refills Pulm rehab referral Trial of spiriva

## 2012-05-18 LAB — RHEUMATOID FACTOR: Rhuematoid fact SerPl-aCnc: <10 IU/mL (ref <=14)

## 2012-05-23 LAB — HYPERSENSITIVITY PNUEMONITIS PROFILE

## 2012-05-30 ENCOUNTER — Encounter: Payer: Self-pay | Admitting: Pulmonary Disease

## 2012-06-01 ENCOUNTER — Ambulatory Visit: Payer: Medicare Other | Admitting: Pulmonary Disease

## 2012-11-01 ENCOUNTER — Telehealth: Payer: Self-pay | Admitting: Pulmonary Disease

## 2012-11-01 NOTE — Telephone Encounter (Signed)
Pt states that he will call when he is ready to make an appt.  Pt asked that we stop calling him about our recall.  I discarded pt's recall as requested.  Nothing further needed at this time.  Antionette Fairy

## 2012-11-14 ENCOUNTER — Telehealth (HOSPITAL_COMMUNITY): Payer: Self-pay | Admitting: Cardiac Rehabilitation

## 2012-11-14 NOTE — Telephone Encounter (Signed)
After leaving multiple phone messages with pt to enroll in pulmonary rehab, letter mailed to pt home address.

## 2013-04-16 ENCOUNTER — Encounter: Payer: Self-pay | Admitting: Cardiovascular Disease

## 2013-04-25 ENCOUNTER — Ambulatory Visit (HOSPITAL_COMMUNITY): Payer: Medicare Other | Attending: Cardiovascular Disease | Admitting: Radiology

## 2013-04-25 ENCOUNTER — Encounter: Payer: Self-pay | Admitting: Cardiovascular Disease

## 2013-04-25 ENCOUNTER — Ambulatory Visit (INDEPENDENT_AMBULATORY_CARE_PROVIDER_SITE_OTHER): Payer: Medicare Other | Admitting: Cardiovascular Disease

## 2013-04-25 VITALS — BP 155/73 | HR 53 | Ht 71.0 in | Wt 215.0 lb

## 2013-04-25 DIAGNOSIS — Z87891 Personal history of nicotine dependence: Secondary | ICD-10-CM | POA: Insufficient documentation

## 2013-04-25 DIAGNOSIS — I359 Nonrheumatic aortic valve disorder, unspecified: Secondary | ICD-10-CM | POA: Insufficient documentation

## 2013-04-25 DIAGNOSIS — I10 Essential (primary) hypertension: Secondary | ICD-10-CM | POA: Insufficient documentation

## 2013-04-25 DIAGNOSIS — R0989 Other specified symptoms and signs involving the circulatory and respiratory systems: Secondary | ICD-10-CM

## 2013-04-25 DIAGNOSIS — E785 Hyperlipidemia, unspecified: Secondary | ICD-10-CM | POA: Insufficient documentation

## 2013-04-25 DIAGNOSIS — I2789 Other specified pulmonary heart diseases: Secondary | ICD-10-CM | POA: Insufficient documentation

## 2013-04-25 DIAGNOSIS — I079 Rheumatic tricuspid valve disease, unspecified: Secondary | ICD-10-CM | POA: Insufficient documentation

## 2013-04-25 DIAGNOSIS — R0602 Shortness of breath: Secondary | ICD-10-CM | POA: Insufficient documentation

## 2013-04-25 DIAGNOSIS — I2510 Atherosclerotic heart disease of native coronary artery without angina pectoris: Secondary | ICD-10-CM

## 2013-04-25 DIAGNOSIS — R0609 Other forms of dyspnea: Secondary | ICD-10-CM

## 2013-04-25 MED ORDER — ATORVASTATIN CALCIUM 20 MG PO TABS: 20.0000 mg | ORAL_TABLET | Freq: Every day | ORAL | Status: DC

## 2013-04-25 NOTE — Progress Notes (Signed)
Echocardiogram performed.  

## 2013-04-25 NOTE — Progress Notes (Signed)
   Cody Olson Date of Birth  08/05/1940       [place] Office    [place] [facility] [address], [address]  [address], [address] [place], [place]  [address]   [place], [place]  [address] [phone]     [phone]   Fax  [phone]    Fax [phone]  Problem List: 1. Coronary artery disease-status post PTCA and stenting of his right coronary artery 2. Hypertension 3. Pulmonary hypertension 4. Pulmonary fibrosis on CT scan 5. Hyperlipidemia 6. Hemochromatosis  History of Present Illness: Cody Olson is an elderly man with a history of coronary artery disease. He status post PTCA and stenting of his right coronary artery. He also has a history of hypertension and pulmonary hypertension. He also has Pulmonary fibrosis diagnosed on CT scan.  He's done well since I last saw him. He's not had any episodes of chest pain or shortness of breath. He has remained very active.  Nov. 12, 2014:  Cody Olson is doing well but he continues to have severe dyspnea.  He has pulmonary fibrosis but the pulmonary doctors dont think it is bad enough to be causiing the dypnea.   No angina.  Dyspnea is always with exertion.   The pain does not feel like his previous episodes of angina.  He can walk on a flat road for 30 minutes, but going up hill, he is short of breath in 10 minutes.    He has had PFTs that revealed some mild pulmanry fibrosis but not severe enough to be causing these symptoms.    Current Outpatient Prescriptions on File Prior to Visit  Medication Sig Dispense Refill  . acyclovir (ZOVIRAX) 400 MG tablet Take 400 mg by mouth 5 (five) times daily as needed.      . ALPRAZolam (XANAX) 0.5 MG tablet Take 0.5 mg by mouth at bedtime as needed.        . amLODipine (NORVASC) 5 MG tablet Take 5 mg by mouth daily.        . aspirin 81 MG tablet Take 81 mg by mouth daily.       . buPROPion (WELLBUTRIN SR) 150 MG 12 hr tablet Take 75 mg by mouth 2 (two) times daily.        .  clobetasol ointment (TEMOVATE) 0.05 % Apply 1 application topically as needed.      . clopidogrel (PLAVIX) 75 MG tablet Take 75 mg by mouth daily.        . doxazosin (CARDURA) 8 MG tablet Take 8 mg by mouth at bedtime.        . hydrochlorothiazide 25 MG tablet Take 12.5 mg by mouth daily.        . HYDROcodone-acetaminophen (VICODIN) 5-500 MG per tablet Take 1 tablet by mouth every 6 (six) hours as needed.      . nitroGLYCERIN (NITROSTAT) 0.4 MG SL tablet Place 0.4 mg under the tongue every 5 (five) minutes as needed.        . olmesartan (BENICAR) 20 MG tablet Take 1 tablet (20 mg total) by mouth daily.  30 tablet  3  . pantoprazole (PROTONIX) 40 MG tablet Take 40 mg by mouth 2 (two) times daily.        . potassium chloride (KLOR-CON) 10 MEQ CR tablet Take 10 mEq by mouth daily.        . tiotropium (SPIRIVA) 18 MCG inhalation capsule Place 1 capsule (18 mcg total) into inhaler and inhale daily.    10 capsule  6   No current facility-administered medications on file prior to visit.    No Known Allergies  Past Medical History  Diagnosis Date  . Coronary artery disease     stent 1996, re expansion in 2009  . Hypertension   . Pulmonary hypertension     PA pressure 41 mm Hg  . Pulmonary fibrosis   . Orthostatic hypotension     Past Surgical History  Procedure Laterality Date  . Coronary angioplasty with stent placement      stenting of RCA  . Cardiac catheterization  02/19/2010    Est. EF of 65-70% Single-vessel coronary artery disease involving the RCA -- Normal LV systolic function  . Coronary angioplasty with stent placement  02/20/2010    Successful PCI of the proximal RCA in-stent restenosis -- The stenosis was reduced from 80-90% down to 10%  . Back surgery      x 3    History  Smoking status  . Former Smoker -- 2.00 packs/day for 25 years  . Types: Cigarettes  . Quit date: 06/03/1995  Smokeless tobacco  . Not on file    History  Alcohol Use No    Family History    Problem Relation Age of Onset  . Heart disease Mother     Reviw of Systems:  Reviewed in the HPI.  All other systems are negative.  Physical Exam: Blood pressure 155/73, pulse 53, height 5' 11" (1.803 m), weight 215 lb (97.523 kg). General: Well developed, well nourished, in no acute distress.  Head: Normocephalic, atraumatic, sclera non-icteric, mucus membranes are moist,   Neck: Supple. Carotids are 2 + without bruits. No JVD  Lungs: few basilar rales in the bases.  Heart: regular rate.  normal  S1 S2.  There is a 2/6 systolic murmur at the LSB.  Abdomen: Soft, non-tender, non-distended with normal bowel sounds. No hepatomegaly. No rebound/guarding. No masses.  Msk:  Strength and tone are normal  Extremities: No clubbing or cyanosis. No edema.  Distal pedal pulses are 2+ and equal bilaterally.  Neuro: Alert and oriented X 3. Moves all extremities spontaneously.  Psych:  Responds to questions appropriately with a normal affect.  ECG: Nov. 12, 2014:  Sinus brady at 53.  Moderate voltage for LVH, previous inf. Mi.  Assessment / Plan:   

## 2013-04-25 NOTE — Patient Instructions (Signed)
Your physician has requested that you have en exercise stress myoview.  Please follow instruction sheet, as given.  Your physician has requested that you have an echocardiogram. Echocardiography is a painless test that uses sound waves to create images of your heart. It provides your doctor with information about the size and shape of your heart and how well your heart's chambers and valves are working. This procedure takes approximately one hour. There are no restrictions for this procedure.  Your physician recommends that you schedule a follow-up appointment in: 3 months with Dr Elease Hashimoto

## 2013-04-25 NOTE — Assessment & Plan Note (Addendum)
Cody Olson is having more dyspnea with exertion - not any significant angina.  i am concerned that this may be an angina equivalent.   Addendum:  The stress myoview was low risk but Cody Olson has continued to have CP - exertional in nature. He thinks his symptoms are similar to his previous episodes of pain and would be in favor of doing a cath.  Will schedule a cath in the next several days.

## 2013-05-04 ENCOUNTER — Encounter: Payer: Self-pay | Admitting: Cardiology

## 2013-05-04 ENCOUNTER — Ambulatory Visit (HOSPITAL_COMMUNITY): Payer: Medicare Other | Attending: Cardiology | Admitting: Radiology

## 2013-05-04 VITALS — BP 143/68 | HR 52 | Ht 71.0 in | Wt 210.0 lb

## 2013-05-04 DIAGNOSIS — R079 Chest pain, unspecified: Secondary | ICD-10-CM | POA: Insufficient documentation

## 2013-05-04 DIAGNOSIS — R0602 Shortness of breath: Secondary | ICD-10-CM

## 2013-05-04 DIAGNOSIS — R0989 Other specified symptoms and signs involving the circulatory and respiratory systems: Secondary | ICD-10-CM | POA: Insufficient documentation

## 2013-05-04 DIAGNOSIS — R0609 Other forms of dyspnea: Secondary | ICD-10-CM | POA: Insufficient documentation

## 2013-05-04 DIAGNOSIS — I2510 Atherosclerotic heart disease of native coronary artery without angina pectoris: Secondary | ICD-10-CM

## 2013-05-04 MED ORDER — TECHNETIUM TC 99M SESTAMIBI GENERIC - CARDIOLITE
33.0000 | Freq: Once | INTRAVENOUS | Status: AC | PRN
  Administered 2013-05-04: 33 via INTRAVENOUS

## 2013-05-04 MED ORDER — REGADENOSON 0.4 MG/5ML IV SOLN
0.4000 mg | Freq: Once | INTRAVENOUS | Status: AC
  Administered 2013-05-04: 0.4 mg via INTRAVENOUS

## 2013-05-04 MED ORDER — TECHNETIUM TC 99M SESTAMIBI GENERIC - CARDIOLITE
11.0000 | Freq: Once | INTRAVENOUS | Status: AC | PRN
  Administered 2013-05-04: 11 via INTRAVENOUS

## 2013-05-04 NOTE — Progress Notes (Signed)
[facility] SITE 3 NUCLEAR MED [address] [place], [place] [address] [phone]    Cardiology Nuclear Med Study  Cody Olson is a 72 y.o. male     MRN : [id number]     DOB: 01/03/1941  Procedure Date: 05/04/2013  Nuclear Med Background Indication for Stress Test:  Evaluation for Ischemia and PTCA/Stent Patency History:  '96 MI, PTCA/Stent RCA,Pulmonary Fibrosis, and 04-25-13 Echo: EF=55-60, mild AS Cardiac Risk Factors: Family History - CAD, History of Smoking, Hypertension and Lipids  Symptoms:  Chest Pain and DOE   Nuclear Pre-Procedure Caffeine/Decaff Intake:  None > 12 hrs NPO After: 12:00am   Lungs:  clear O2 Sat: 94% on room air. IV 0.9% NS with Angio Cath:  20g  IV Site: R Hand x 1, tolerated well IV Started by:  Irean Hong, RN  Chest Size (in):  46 Cup Size: n/a  Height: 5\' 11"  (1.803 m)  Weight:  210 lb (95.255 kg)  BMI:  Body mass index is 29.3 kg/(m^2). Tech Comments:  Held HCTZ today    Nuclear Med Study 1 or 2 day study: 1 day  Stress Test Type:  Lexiscan  Reading MD: Cassell Clement, MD  Order Authorizing Provider:  Kristeen Miss, MD  Resting Radionuclide: Technetium 35m Sestamibi  Resting Radionuclide Dose: 11.0 mCi   Stress Radionuclide:  Technetium 48m Sestamibi  Stress Radionuclide Dose: 33.0 mCi           Stress Protocol Rest HR: 52 Stress HR: 116  Rest BP: 143/68 Stress BP: 189/61  Exercise Time (min): 4:00 METS: 5.10   Predicted Max HR: 149 bpm % Max HR: 77.85 bpm Rate Pressure Product: 21308   Dose of Adenosine (mg):  n/a Dose of Lexiscan: 0.4 mg  Dose of Atropine (mg): n/a Dose of Dobutamine: n/a mcg/kg/min (at max HR)  Stress Test Technologist: Irean Hong, RN  Nuclear Technologist:  Dario Guardian, CNMT     Rest Procedure:  Myocardial perfusion imaging was performed at rest 45 minutes following the intravenous administration of Technetium 11m Sestamibi. Rest ECG: NSR - Normal EKG  Stress Procedure: The patient  attempted to walk the treadmill utilizing the Bruce Protocol for 4:00 , but was unable to reach target heart rate due to marked DOE. The treadmill was stopped. The patients' lung fields were clear. O2 sat was 91% on RA. The patient was given Albuterol inhaler x 2 sprays, and O2 started 2L via nasal cannula with improvement of symptoms.  The patient received IV Lexiscan 0.4 mg over 15-seconds.  Technetium 37m Sestamibi injected at 30-seconds. The patient complained of stomach pain, but denied chest pain. Quantitative spect images were obtained after a 45 minute delay. Stress ECG: No significant change from baseline ECG  QPS Raw Data Images:  Normal; no motion artifact; normal heart/lung ratio. Stress Images:  Normal homogeneous uptake in all areas of the myocardium. Rest Images:  Normal homogeneous uptake in all areas of the myocardium. Subtraction (SDS):  No evidence of ischemia. Transient Ischemic Dilatation (Normal <1.22):  0.98 Lung/Heart Ratio (Normal <0.45):  0.33  Quantitative Gated Spect Images QGS EDV:  104 ml QGS ESV:  41 ml  Impression Exercise Capacity:  Attempted ETT,unable to reach target heart rate,changed to sitting Lexiscan. BP Response:  Normal blood pressure response. Clinical Symptoms:  There is dyspnea. ECG Impression:  No significant ST segment change suggestive of ischemia. Comparison with Prior Nuclear Study: No previous nuclear study performed  Overall Impression:  Low risk stress  nuclear study. No ischemia.  Very poor exercise tolerance limited by dyspnea. Switched from [employer] protocol to [employer].  LV Ejection Fraction: 61%.  LV Wall Motion:  NL LV Function; NL Wall Motion  [employer]  .

## 2013-05-07 ENCOUNTER — Telehealth: Payer: Self-pay | Admitting: Cardiovascular Disease

## 2013-05-07 NOTE — Telephone Encounter (Signed)
Preliminary Results of stress test reviewed, Please advise what the next test was going to be if this test was normal. Please recommend a different pulmonologist.

## 2013-05-07 NOTE — Telephone Encounter (Signed)
Since cp has continued a heart cath was suggested. Wife/ pt informed and will call back tomorrow with when they would like to schedule. Told to go to ED if further cp. Agreed to plan.

## 2013-05-07 NOTE — Telephone Encounter (Signed)
Patient needs you to giver her a call back please.

## 2013-05-07 NOTE — Telephone Encounter (Signed)
New message ° ° ° ° °Want test results °

## 2013-05-07 NOTE — Telephone Encounter (Signed)
Wife wanted Dr to know that pt has had higher bp than usual,180/90.  He has had some night sweats and has had episodes of chest tightness while sitting in chair, did not try nitro- pain went away on its own.  Please advise.

## 2013-05-08 NOTE — Telephone Encounter (Signed)
msg left to call back to see if they wanted to proceed with a LHC, asked them to call back.

## 2013-05-08 NOTE — Telephone Encounter (Signed)
Follow Up  ° °Returned call  °

## 2013-05-08 NOTE — Telephone Encounter (Signed)
Went to his internal med Dr last night.  meds were adjusted. Pt/wife made a f/u app 06/18/12. App to discuss need for LHC. Pt/wife would like to wait and see if bp lowers/ symptoms happen again. Advised to try nitro if sx happens again.  Told to call with any further questions or concerns.  Pt/wife agreed to plan and verbalized understanding.

## 2013-05-14 ENCOUNTER — Encounter: Payer: Self-pay | Admitting: *Deleted

## 2013-05-14 ENCOUNTER — Encounter (HOSPITAL_COMMUNITY): Payer: Self-pay | Admitting: Pharmacy Technician

## 2013-05-14 ENCOUNTER — Telehealth: Payer: Self-pay | Admitting: Cardiovascular Disease

## 2013-05-14 DIAGNOSIS — I2510 Atherosclerotic heart disease of native coronary artery without angina pectoris: Secondary | ICD-10-CM

## 2013-05-14 DIAGNOSIS — R079 Chest pain, unspecified: Secondary | ICD-10-CM

## 2013-05-14 NOTE — Telephone Encounter (Signed)
New message  Patient's wife has a couple of questions for you, If you cant call her today she said tomorrow would be fine. Please call and advise.

## 2013-05-14 NOTE — Telephone Encounter (Signed)
Cath accepted for 05/24/13 with labs on 05/21/13 and will ask to see me for cath letter.

## 2013-05-14 NOTE — Telephone Encounter (Signed)
Wants to proceed with LHC not this week but next week. Pt has had cp per wife/ nitro used once and helped. Advised LHC sooner than later but they prefer next week. They will go to ED with further cp.

## 2013-05-21 ENCOUNTER — Other Ambulatory Visit (INDEPENDENT_AMBULATORY_CARE_PROVIDER_SITE_OTHER): Payer: Medicare Other

## 2013-05-21 DIAGNOSIS — I2510 Atherosclerotic heart disease of native coronary artery without angina pectoris: Secondary | ICD-10-CM

## 2013-05-21 DIAGNOSIS — R079 Chest pain, unspecified: Secondary | ICD-10-CM

## 2013-05-21 LAB — CBC
HCT: 38.5 % — ABNORMAL LOW (ref 39.0–52.0)
Hemoglobin: 13.4 g/dL (ref 13.0–17.0)
MCHC: 34.7 g/dL (ref 30.0–36.0)
MCV: 95.3 fl (ref 78.0–100.0)
Platelets: 256 10*3/uL (ref 150.0–400.0)
RBC: 4.03 Mil/uL — ABNORMAL LOW (ref 4.22–5.81)
RDW: 14 % (ref 11.5–14.6)

## 2013-05-21 LAB — BASIC METABOLIC PANEL
CO2: 23 mEq/L (ref 19–32)
Calcium: 8.8 mg/dL (ref 8.4–10.5)
Chloride: 107 mEq/L (ref 96–112)
GFR: 54.82 mL/min — ABNORMAL LOW (ref >60.00)
Glucose, Bld: 126 mg/dL — ABNORMAL HIGH (ref 70–99)
Potassium: 4.1 mEq/L (ref 3.5–5.1)
Sodium: 138 mEq/L (ref 135–145)

## 2013-05-21 LAB — PROTIME-INR
INR: 1.3 ratio — ABNORMAL HIGH (ref 0.8–1.0)
Prothrombin Time: 13.3 s — ABNORMAL HIGH (ref 10.2–12.4)

## 2013-05-24 ENCOUNTER — Ambulatory Visit (HOSPITAL_COMMUNITY)
Admission: RE | Admit: 2013-05-24 | Discharge: 2013-05-24 | Disposition: A | Discharge status: Home / Self Care | Payer: Medicare Other | Source: Ambulatory Visit | Attending: Cardiovascular Disease | Admitting: Cardiovascular Disease

## 2013-05-24 ENCOUNTER — Encounter (HOSPITAL_COMMUNITY)
Admission: RE | Disposition: A | Discharge status: Home / Self Care | Payer: Self-pay | Source: Ambulatory Visit | Attending: Cardiovascular Disease

## 2013-05-24 DIAGNOSIS — Z7982 Long term (current) use of aspirin: Secondary | ICD-10-CM | POA: Insufficient documentation

## 2013-05-24 DIAGNOSIS — Z87891 Personal history of nicotine dependence: Secondary | ICD-10-CM | POA: Insufficient documentation

## 2013-05-24 DIAGNOSIS — I10 Essential (primary) hypertension: Secondary | ICD-10-CM | POA: Insufficient documentation

## 2013-05-24 DIAGNOSIS — E785 Hyperlipidemia, unspecified: Secondary | ICD-10-CM | POA: Insufficient documentation

## 2013-05-24 DIAGNOSIS — Z7902 Long term (current) use of antithrombotics/antiplatelets: Secondary | ICD-10-CM | POA: Insufficient documentation

## 2013-05-24 DIAGNOSIS — R079 Chest pain, unspecified: Secondary | ICD-10-CM | POA: Insufficient documentation

## 2013-05-24 DIAGNOSIS — I2510 Atherosclerotic heart disease of native coronary artery without angina pectoris: Secondary | ICD-10-CM

## 2013-05-24 DIAGNOSIS — Z9861 Coronary angioplasty status: Secondary | ICD-10-CM | POA: Insufficient documentation

## 2013-05-24 DIAGNOSIS — J8410 Pulmonary fibrosis, unspecified: Secondary | ICD-10-CM | POA: Insufficient documentation

## 2013-05-24 DIAGNOSIS — Z79899 Other long term (current) drug therapy: Secondary | ICD-10-CM | POA: Insufficient documentation

## 2013-05-24 DIAGNOSIS — I2789 Other specified pulmonary heart diseases: Secondary | ICD-10-CM | POA: Insufficient documentation

## 2013-05-24 HISTORY — PX: LEFT HEART CATHETERIZATION WITH CORONARY ANGIOGRAM: SHX5451

## 2013-05-24 SURGERY — LEFT HEART CATHETERIZATION WITH CORONARY ANGIOGRAM
Anesthesia: LOCAL

## 2013-05-24 MED ORDER — SODIUM CHLORIDE 0.9 % IV SOLN: INTRAVENOUS | Status: DC

## 2013-05-24 MED ORDER — HEPARIN (PORCINE) IN NACL 2-0.9 UNIT/ML-% IJ SOLN
INTRAMUSCULAR | Status: AC
  Filled 2013-05-24: qty 1000

## 2013-05-24 MED ORDER — VERAPAMIL HCL 2.5 MG/ML IV SOLN
INTRAVENOUS | Status: AC
  Filled 2013-05-24: qty 2

## 2013-05-24 MED ORDER — LIDOCAINE HCL (PF) 1 % IJ SOLN
INTRAMUSCULAR | Status: AC
  Filled 2013-05-24: qty 30

## 2013-05-24 MED ORDER — FENTANYL CITRATE 0.05 MG/ML IJ SOLN
INTRAMUSCULAR | Status: AC
  Filled 2013-05-24: qty 2

## 2013-05-24 MED ORDER — ACETAMINOPHEN 325 MG PO TABS: 650.0000 mg | ORAL_TABLET | ORAL | Status: DC | PRN

## 2013-05-24 MED ORDER — SODIUM CHLORIDE 0.9 % IV SOLN: 250.0000 mL | INTRAVENOUS | Status: DC | PRN

## 2013-05-24 MED ORDER — SODIUM CHLORIDE 0.9 % IV SOLN
INTRAVENOUS | Status: DC
  Administered 2013-05-24: 10:00:00 via INTRAVENOUS

## 2013-05-24 MED ORDER — ONDANSETRON HCL 4 MG/2ML IJ SOLN: 4.0000 mg | Freq: Four times a day (QID) | INTRAMUSCULAR | Status: DC | PRN

## 2013-05-24 MED ORDER — MIDAZOLAM HCL 2 MG/2ML IJ SOLN
INTRAMUSCULAR | Status: AC
  Filled 2013-05-24: qty 2

## 2013-05-24 MED ORDER — ASPIRIN 81 MG PO CHEW
81.0000 mg | CHEWABLE_TABLET | ORAL | Status: AC
  Administered 2013-05-24: 81 mg via ORAL
  Filled 2013-05-24: qty 1

## 2013-05-24 MED ORDER — SODIUM CHLORIDE 0.9 % IJ SOLN: 3.0000 mL | INTRAMUSCULAR | Status: DC | PRN

## 2013-05-24 MED ORDER — SODIUM CHLORIDE 0.9 % IJ SOLN: 3.0000 mL | Freq: Two times a day (BID) | INTRAMUSCULAR | Status: DC

## 2013-05-24 NOTE — H&P (View-Only) (Signed)
Cody Olson Date of Birth  1940/10/22       [facility]    [place] [address], [address]  [address] [place], [place]  [address]   [place], [place]  [address] [phone]     [phone]   Fax  [phone]    Fax [phone]  Problem List: 1. Coronary artery disease-status post PTCA and stenting of his right coronary artery 2. Hypertension 3. Pulmonary hypertension 4. Pulmonary fibrosis on CT scan 5. Hyperlipidemia 6. Hemochromatosis  History of Present Illness: Cody Olson is an elderly man with a history of coronary artery disease. He status post PTCA and stenting of his right coronary artery. He also has a history of hypertension and pulmonary hypertension. He also has Pulmonary fibrosis diagnosed on CT scan.  He's done well since I last saw him. He's not had any episodes of chest pain or shortness of breath. He has remained very active.  Nov. 12, 2014:  Cody Olson is doing well but he continues to have severe dyspnea.  He has pulmonary fibrosis but the pulmonary doctors dont think it is bad enough to be causiing the dypnea.   No angina.  Dyspnea is always with exertion.   The pain does not feel like his previous episodes of angina.  He can walk on a flat road for 30 minutes, but going up hill, he is short of breath in 10 minutes.    He has had PFTs that revealed some mild pulmanry fibrosis but not severe enough to be causing these symptoms.    Current Outpatient Prescriptions on File Prior to Visit  Medication Sig Dispense Refill  . acyclovir (ZOVIRAX) 400 MG tablet Take 400 mg by mouth 5 (five) times daily as needed.      . ALPRAZolam (XANAX) 0.5 MG tablet Take 0.5 mg by mouth at bedtime as needed.        Marland Kitchen amLODipine (NORVASC) 5 MG tablet Take 5 mg by mouth daily.        Marland Kitchen aspirin 81 MG tablet Take 81 mg by mouth daily.       Marland Kitchen buPROPion (WELLBUTRIN SR) 150 MG 12 hr tablet Take 75 mg by mouth 2 (two) times daily.        .  clobetasol ointment (TEMOVATE) 0.05 % Apply 1 application topically as needed.      . clopidogrel (PLAVIX) 75 MG tablet Take 75 mg by mouth daily.        Marland Kitchen doxazosin (CARDURA) 8 MG tablet Take 8 mg by mouth at bedtime.        . hydrochlorothiazide 25 MG tablet Take 12.5 mg by mouth daily.        Marland Kitchen HYDROcodone-acetaminophen (VICODIN) 5-500 MG per tablet Take 1 tablet by mouth every 6 (six) hours as needed.      . nitroGLYCERIN (NITROSTAT) 0.4 MG SL tablet Place 0.4 mg under the tongue every 5 (five) minutes as needed.        Marland Kitchen olmesartan (BENICAR) 20 MG tablet Take 1 tablet (20 mg total) by mouth daily.  30 tablet  3  . pantoprazole (PROTONIX) 40 MG tablet Take 40 mg by mouth 2 (two) times daily.        . potassium chloride (KLOR-CON) 10 MEQ CR tablet Take 10 mEq by mouth daily.        Marland Kitchen tiotropium (SPIRIVA) 18 MCG inhalation capsule Place 1 capsule (18 mcg total) into inhaler and inhale daily.  10 capsule  6   No current facility-administered medications on file prior to visit.    No Known Allergies  Past Medical History  Diagnosis Date  . Coronary artery disease     stent 1996, re expansion in 2009  . Hypertension   . Pulmonary hypertension     PA pressure 41 mm Hg  . Pulmonary fibrosis   . Orthostatic hypotension     Past Surgical History  Procedure Laterality Date  . Coronary angioplasty with stent placement      stenting of RCA  . Cardiac catheterization  02/19/2010    Est. EF of 65-70% Single-vessel coronary artery disease involving the RCA -- Normal LV systolic function  . Coronary angioplasty with stent placement  02/20/2010    Successful PCI of the proximal RCA in-stent restenosis -- The stenosis was reduced from 80-90% down to 10%  . Back surgery      x 3    History  Smoking status  . Former Smoker -- 2.00 packs/day for 25 years  . Types: Cigarettes  . Quit date: 06/03/1995  Smokeless tobacco  . Not on file    History  Alcohol Use No    Family History    Problem Relation Age of Onset  . Heart disease Mother     Reviw of Systems:  Reviewed in the HPI.  All other systems are negative.  Physical Exam: Blood pressure 155/73, pulse 53, height 5\' 11"  (1.803 m), weight 215 lb (97.523 kg). General: Well developed, well nourished, in no acute distress.  Head: Normocephalic, atraumatic, sclera non-icteric, mucus membranes are moist,   Neck: Supple. Carotids are 2 + without bruits. No JVD  Lungs: few basilar rales in the bases.  Heart: regular rate.  normal  S1 S2.  There is a 2/6 systolic murmur at the LSB.  Abdomen: Soft, non-tender, non-distended with normal bowel sounds. No hepatomegaly. No rebound/guarding. No masses.  Msk:  Strength and tone are normal  Extremities: No clubbing or cyanosis. No edema.  Distal pedal pulses are 2+ and equal bilaterally.  Neuro: Alert and oriented X 3. Moves all extremities spontaneously.  Psych:  Responds to questions appropriately with a normal affect.  ECG: Nov. 12, 2014:  Sinus brady at 31.  Moderate voltage for LVH, previous inf. Mi.  Assessment / Plan:

## 2013-05-24 NOTE — Interval H&P Note (Signed)
Cath Lab Visit (complete for each Cath Lab visit)  Clinical Evaluation Leading to the Procedure:   ACS: no  Non-ACS:    Anginal Classification: CCS II  Anti-ischemic medical therapy: Minimal Therapy (1 class of medications)  Non-Invasive Test Results: No non-invasive testing performed  Prior CABG: No previous CABG      History and Physical Interval Note:  05/24/2013 10:57 AM  Cody Olson  has presented today for surgery, with the diagnosis of Chest pain  The various methods of treatment have been discussed with the patient and family. After consideration of risks, benefits and other options for treatment, the patient has consented to  Procedure(s): LEFT HEART CATHETERIZATION WITH CORONARY ANGIOGRAM (N/A) as a surgical intervention .  The patient's history has been reviewed, patient examined, no change in status, stable for surgery.  I have reviewed the patient's chart and labs.  Questions were answered to the patient's satisfaction.     Elyn Aquas.

## 2013-05-24 NOTE — CV Procedure (Signed)
     Cardiac Cath Note  CULLAN LAUNER [id number] November 30, 1940  Procedure: left  Heart Cardiac Catheterization Note Indications: chest discomfort  Procedure Details Consent: Obtained Time Out: Verified patient identification, verified procedure, site/side was marked, verified correct patient position, special equipment/implants available, Radiology Safety Procedures followed,  medications/allergies/relevent history reviewed, required imaging and test results available.  Performed   Medications: Fentanyl: 50 mcg IV Versed: 2 mg IV Verapamil 3 mg IA Heparin 500 units IV  The right femoral artery and right radial artery were  easily canulated using a modified Seldinger technique.  We initially used the right radial artery for access but could not engage the left coronary system.  We then changed to a left femoral approach.   Hemodynamics:   LV pressure: 168/20 Aortic pressure: 165/66  Angiography   Left Main: large, smooth and normal   Left anterior Descending:  Moderate - small in size.  Appears to taper normally .  No significant atheroma.   Daigs are small but are normal.  Left Circumflex: large, domanant.  OM 1 is large and normal ,  Distal LCx / PDA are normal  Right Coronary Artery: small and non dominant.  The proximal stent has a ~50% in-stent restenosis.   The RCA supplies several branches to the RCA but all are very small.   LV Gram: normal LV function , EF 65%   Complications: No apparent complications Patient did tolerate procedure well.  Contrast used: 140 cc  Conclusions:   1. Mild - moderate CAD involving a small , nondominant RCA.   The remaining coronaries are normal.   2. Normal LV systolic function  I suspect his CP is noncardiac.   Vesta Mixer, Montez Hageman., MD, [facility] 05/24/2013, 12:18 PM Office - [phone] Pager [phone]

## 2013-06-18 ENCOUNTER — Ambulatory Visit: Payer: Medicare Other | Admitting: Cardiovascular Disease

## 2013-07-03 ENCOUNTER — Ambulatory Visit: Payer: Medicare Other | Admitting: Cardiovascular Disease

## 2013-07-30 ENCOUNTER — Ambulatory Visit: Payer: Medicare Other | Admitting: Cardiovascular Disease

## 2013-11-29 IMAGING — CT CT CHEST W/O CM
2 of 6 series · 8 of 36 positions shown, 10 images · non-contrast
Comparison: 01/13/2012

CLINICAL DATA: Productive cough.

CT CHEST WITHOUT CONTRAST
TECHNIQUE: Multidetector CT imaging of the chest was performed
following the standard protocol without IV contrast.

[Series 8: hires retro entire lungs · axial · 0.77mm/px · z∈[-234,-34]mm · 5 of 31 slices shown, 7 images]
[im 6/31  mediastinal]
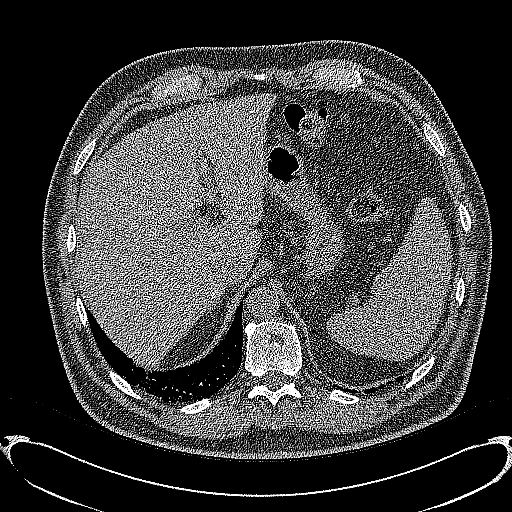
[im 6/31  lung]
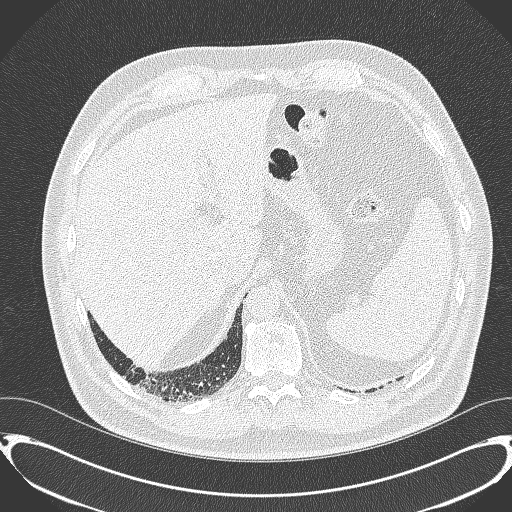
[im 11/31  lung]
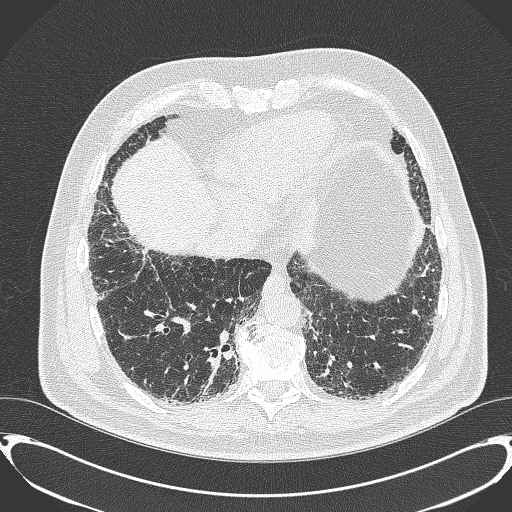
[im 16/31  lung]
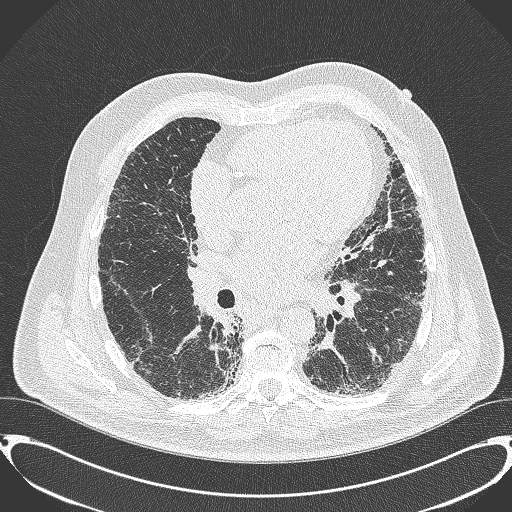
[im 21/31  lung]
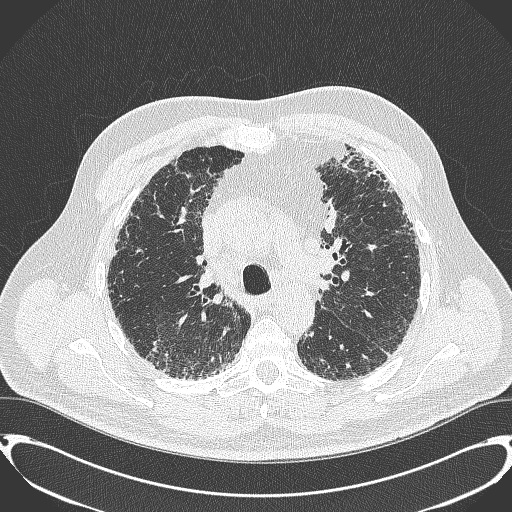
[im 26/31  mediastinal]
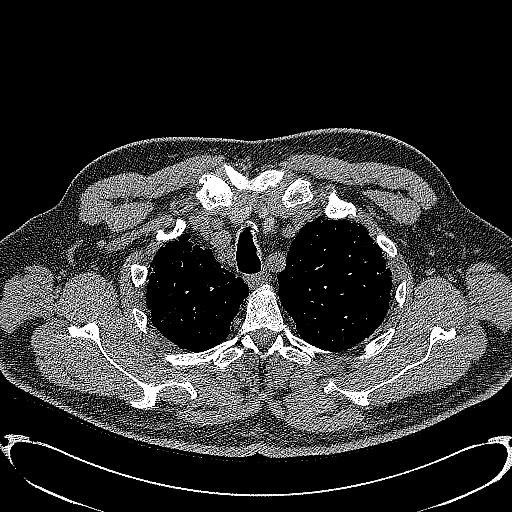
[im 26/31  lung]
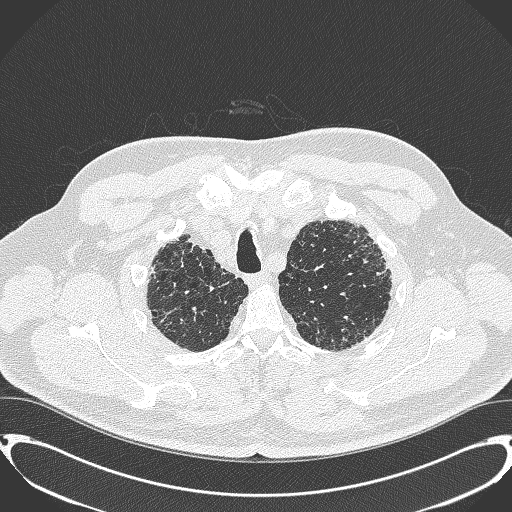

[Series 602: cor · coronal · 0.77mm/px · 3 of 117 slices shown]
[im 24/117  lung]
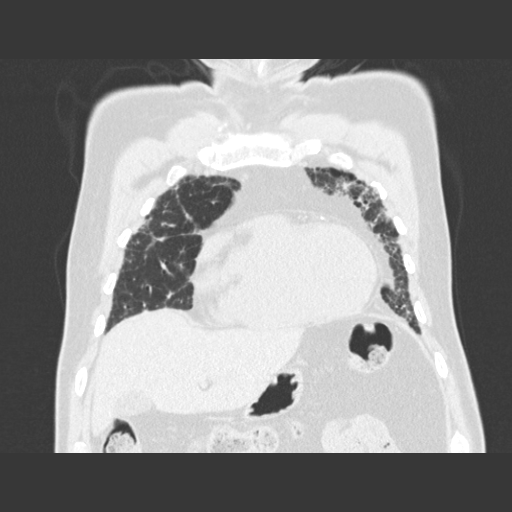
[im 47/117  lung]
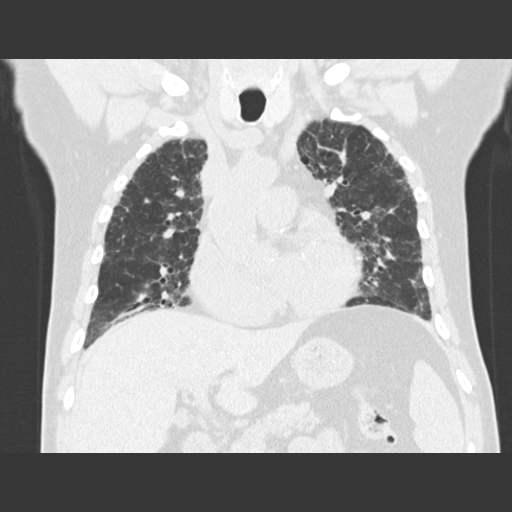
[im 70/117  lung]
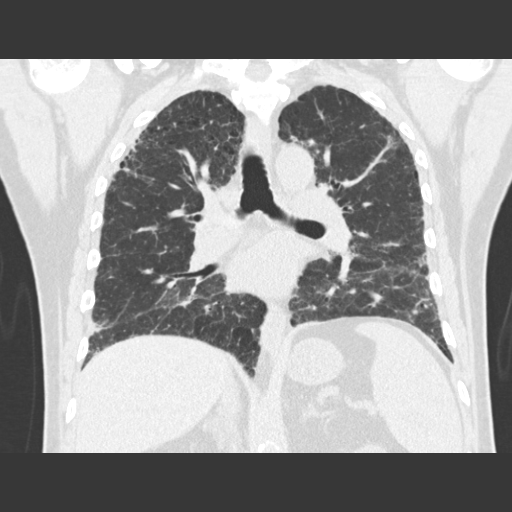

[8 of 36 positions shown; findings below may reference images not displayed]

FINDINGS: Coarsened interstitial prominence throughout the lungs.
Areas of early fibrosis with scattered ground-glass opacities and
architectural distortion.  This is slightly more pronounced in the
mid and lower lung zone.

Heart is mildly enlarged.  Dense coronary artery calcifications.
Aorta is tortuous, non-aneurysmal.

There are scattered small and borderline sized mediastinal lymph
nodes.  No axillary or definite hilar adenopathy on this unenhanced
study. Visualized thyroid and chest wall soft tissues unremarkable.
Imaging into the upper abdomen shows no acute findings.
IMPRESSION: Peripheral architectural distortion and scattered ground-glass
opacities compatible with early fibrosis, most pronounced in the
mid and lower lung zones, but also involving the upper lung zones.
No acute opacities or effusions.

Small and borderline sized mediastinal lymph nodes.

## 2014-02-21 ENCOUNTER — Ambulatory Visit (INDEPENDENT_AMBULATORY_CARE_PROVIDER_SITE_OTHER): Payer: Medicare Other | Admitting: Cardiovascular Disease

## 2014-02-21 ENCOUNTER — Encounter: Payer: Self-pay | Admitting: Cardiovascular Disease

## 2014-02-21 VITALS — BP 90/54 | HR 69 | Ht 71.0 in

## 2014-02-21 DIAGNOSIS — I482 Chronic atrial fibrillation, unspecified: Secondary | ICD-10-CM

## 2014-02-21 DIAGNOSIS — E7800 Pure hypercholesterolemia, unspecified: Secondary | ICD-10-CM

## 2014-02-21 DIAGNOSIS — I4891 Unspecified atrial fibrillation: Secondary | ICD-10-CM | POA: Insufficient documentation

## 2014-02-21 DIAGNOSIS — I10 Essential (primary) hypertension: Secondary | ICD-10-CM

## 2014-02-21 DIAGNOSIS — I2510 Atherosclerotic heart disease of native coronary artery without angina pectoris: Secondary | ICD-10-CM

## 2014-02-21 MED ORDER — POTASSIUM CHLORIDE CRYS ER 20 MEQ PO TBCR: 20.0000 meq | EXTENDED_RELEASE_TABLET | Freq: Every day | ORAL | Status: AC

## 2014-02-21 MED ORDER — DILTIAZEM HCL ER COATED BEADS 120 MG PO CP24: 120.0000 mg | ORAL_CAPSULE | Freq: Every day | ORAL | Status: DC

## 2014-02-21 MED ORDER — ASPIRIN 81 MG PO TABS: 81.0000 mg | ORAL_TABLET | Freq: Every day | ORAL | Status: AC

## 2014-02-21 MED ORDER — METOPROLOL TARTRATE 50 MG PO TABS: 25.0000 mg | ORAL_TABLET | Freq: Two times a day (BID) | ORAL | Status: AC

## 2014-02-21 MED ORDER — ATORVASTATIN CALCIUM 40 MG PO TABS: 20.0000 mg | ORAL_TABLET | Freq: Every day | ORAL | Status: AC

## 2014-02-21 MED ORDER — ATORVASTATIN CALCIUM 20 MG PO TABS: 20.0000 mg | ORAL_TABLET | Freq: Every day | ORAL | Status: DC

## 2014-02-21 NOTE — Assessment & Plan Note (Signed)
Will reduce his atrovastatin to 20 mg a day.   He has been well controlled on that dose.

## 2014-02-21 NOTE — Patient Instructions (Addendum)
Check the price of the following anticoagulants:  Pradaxa 150 mg twice a day Xarelto 20 mg a day Eliquis 5 mg twice a day Savaysa 60 mg a day.  Your physician has recommended you make the following change in your medication:   1. Decrease Aspirin 81 mg to 1 tablet daily.   2. Decrease Metoprolol 50 mg to 1/2 tablet twice a day.  3. Stop Diltiazem 60 mg.   4. Start Cardizem CD 120 mg 1 tablet daily.   5. Chang potassium to 20 MEQ 1 tablet daily.   6. Decrease Lipitor to 20 mg daily.   Schedule Coumadin Clinic Visit for Tuesday 02/26/14.  Your physician recommends that you schedule a follow-up appointment in: 3 months with Dr. Elease Hashimoto with [facility].

## 2014-02-21 NOTE — Assessment & Plan Note (Signed)
Stable from a coronary standpoint. He's not having episodes of angina. His last cardiac catheterization in December looked okay.

## 2014-02-21 NOTE — Assessment & Plan Note (Addendum)
Joe has developed atrial fibrillation.  It was first diagnosed during his  hospitalization this past summer at [facility]. He has severe pulmonary fibrosis and suspect that this lung disease is the cause of his atrial fibrillation. His rate is well controlled and he is completely asymptomatic from an atrial fib standpoint. He's currently on Coumadin and his last INR was 2.6 measured 3 days ago.  At this point I think that we should continue with rate control and anticoagulation. He cannot even feel that he is in atrial fib and his atrial fibrillation certainly is not slowing him down anyway. He is severely debilitated by his pulmonary fibrosis and this will remain his biggest challenge.  We will see if we can get him on one of the NOACs.  I have Given the wife a list of the new anticoagulants and she'll see which one is favored by her insurance plan.  The metoprolol is causing some fatigue. We'll decrease the dose to 25 mg twice a day. He's taking Cardizem immediate release 60 mg twice a day. We'll change this to Cardizem CD 120 mg a day.  We will reduce his potassium chloride to 20 mEq once a day.  He'll be seeing the pulmonologist on regular basis.  He'll be given routine lab work.  I will see  again in 3 months for followup office visit and basic metabolic profile.

## 2014-02-21 NOTE — Progress Notes (Signed)
Cody Olson Date of Birth  1940-09-18       [facility]    [place] [address]  [address] [place], [place]  [address]   [place], [place]  [address] [phone]     [phone]   Fax  [phone]    Fax [phone]  Problem List: 1. Coronary artery disease-status post PTCA and stenting of his right coronary artery 2. Hypertension 3. Pulmonary hypertension 4. Pulmonary fibrosis on CT scan 5. Hyperlipidemia 6. Hemochromatosis 7. Atrial fibrillation  History of Present Illness: Cody Olson is an elderly man with a history of coronary artery disease. He status post PTCA and stenting of his right coronary artery. He also has a history of hypertension and pulmonary hypertension. He also has Pulmonary fibrosis diagnosed on CT scan.  He's done well since I last saw him. He's not had any episodes of chest pain or shortness of breath. He has remained very active.  Nov. 12, 2014:  Cody Olson is doing well but he continues to have severe dyspnea.  He has pulmonary fibrosis but the pulmonary doctors dont think it is bad enough to be causiing the dypnea.   No angina.  Dyspnea is always with exertion.   The pain does not feel like his previous episodes of angina.  He can walk on a flat road for 30 minutes, but going up hill, he is short of breath in 10 minutes.    He has had PFTs that revealed some mild pulmanry fibrosis but not severe enough to be causing these symptoms.   Sept. 10, 2015:  Cody Olson was last seen in November , 2014.  We performed a cardiac catheterization in December, 2014 which revealed widely patent coronary arteries. He has been in the hospital ( [facility]) for 69 days.  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  He is now in pulmonary rehab. He has developed atrial fibrillation. [facility]. Then went to [facility] [place]  For 3 weeks.  Just got home 3 days ago.    He arrived in a wheel  chair.  He uses a walker at home.  .  Able to get to the bathroom and shower by himself.     Current Outpatient Prescriptions on File Prior to Visit  Medication Sig Dispense Refill  . ALPRAZolam (XANAX) 0.5 MG tablet Take 0.5 mg by mouth at bedtime as needed for sleep.       . budesonide-formoterol (SYMBICORT) 80-4.5 MCG/ACT inhaler Inhale 1 puff into the lungs 2 (two) times daily.      Marland Kitchen doxazosin (CARDURA) 8 MG tablet Take 8 mg by mouth at bedtime.        . hydrochlorothiazide 25 MG tablet Take 12.5 mg by mouth daily.        . nitroGLYCERIN (NITROSTAT) 0.4 MG SL tablet Place 0.4 mg under the tongue every 5 (five) minutes as needed for chest pain.        No current facility-administered medications on file prior to visit.    Allergies  Allergen Reactions  . Ace Inhibitors Cough  . Fluticasone Furoate-Vilanterol Other (See Comments)  . Losartan     Other reaction(s): Lethargy (intolerance) Fatigue    Past Medical History  Diagnosis Date  . Coronary artery disease     stent 1996, re expansion in 2009  . Hypertension   . Pulmonary hypertension     PA pressure 41 mm Hg  .  Pulmonary fibrosis   . Orthostatic hypotension     Past Surgical History  Procedure Laterality Date  . Coronary angioplasty with stent placement      stenting of RCA  . Cardiac catheterization      Est. EF of 65-70% Single-vessel coronary artery disease involving the RCA -- Normal LV systolic function  . Coronary angioplasty with stent placement      Successful PCI of the proximal RCA in-stent restenosis -- The stenosis was reduced from 80-90% down to 10%  . Back surgery      x 3    History  Smoking status  . Former Smoker -- 2.00 packs/day for 25 years  . Types: Cigarettes  . Quit date: 06/03/1995  Smokeless tobacco  . Not on file    History  Alcohol Use No    Family History  Problem Relation Age of Onset  . Heart disease Mother     Reviw of Systems:  Reviewed in the HPI.  All other  systems are negative.  Physical Exam: Blood pressure 90/54, pulse 69, height  (1.803 m), weight 0 lb (0 kg). General: Well developed, well nourished, in no acute distress.  Head: Normocephalic, atraumatic, sclera non-icteric, mucus membranes are moist,   Neck: Supple. Carotids are 2 + without bruits. No JVD  Lungs: few basilar rales in the bases.  Heart: irregularly irregular.   normal  S1 S2.  There is a 2/6 systolic murmur at the LSB.  Abdomen: Soft, non-tender, non-distended with normal bowel sounds. No hepatomegaly. No rebound/guarding. No masses.  Msk:  Strength and tone are normal  Extremities: No clubbing or cyanosis. No edema.  Distal pedal pulses are 2+ and equal bilaterally.  Neuro: Alert and oriented X 3. Moves all extremities spontaneously.  Psych:  Responds to questions appropriately with a normal affect.  ECG: February 21, 2014: Atrial fibrillation with a ventricular rate of 69. He has nonspecific ST and T wave changes.  Assessment / Plan:

## 2014-02-27 ENCOUNTER — Ambulatory Visit (INDEPENDENT_AMBULATORY_CARE_PROVIDER_SITE_OTHER): Payer: Medicare Other | Admitting: *Deleted

## 2014-02-27 ENCOUNTER — Telehealth: Payer: Self-pay | Admitting: Cardiovascular Disease

## 2014-02-27 DIAGNOSIS — I4891 Unspecified atrial fibrillation: Secondary | ICD-10-CM

## 2014-02-27 DIAGNOSIS — I480 Paroxysmal atrial fibrillation: Secondary | ICD-10-CM

## 2014-02-27 LAB — POCT INR: INR: 1.4

## 2014-02-27 MED ORDER — WARFARIN SODIUM 2 MG PO TABS: ORAL_TABLET | ORAL | Status: DC

## 2014-02-27 MED ORDER — APIXABAN 5 MG PO TABS: 5.0000 mg | ORAL_TABLET | Freq: Two times a day (BID) | ORAL | Status: DC

## 2014-02-27 NOTE — Telephone Encounter (Signed)
Follow up   ° ° ° ° ° °Returning Cody Olson's call °

## 2014-02-27 NOTE — Telephone Encounter (Signed)
Spoke with patient's wife who states patient will need prior authorization for Pradaxa, Eliquis or Xarelto and that they are ready to proceed with whichever medication Dr. Elease Hashimoto feels is best for patient.  I advised that I will discuss with him and send order.  Wife verbalized understanding and agreement.

## 2014-02-27 NOTE — Telephone Encounter (Signed)
Left message for patient to call back  

## 2014-02-27 NOTE — Telephone Encounter (Signed)
Eliquis 5 mg BID ordered per Dr. Elease Hashimoto

## 2014-02-27 NOTE — Telephone Encounter (Signed)
New message  Pt called states that he can receive the medication requested through insurance but a prior auth is needed.. Pradaxa, Xarelto, Eliquis .Marland Kitchen Please call back to discuss

## 2014-02-28 ENCOUNTER — Telehealth: Payer: Self-pay | Admitting: *Deleted

## 2014-02-28 NOTE — Telephone Encounter (Signed)
Pt has been approved for Eliquis 5 mg 02/28/2014-03/01/2015.

## 2014-03-06 ENCOUNTER — Ambulatory Visit (INDEPENDENT_AMBULATORY_CARE_PROVIDER_SITE_OTHER): Payer: Medicare Other | Admitting: *Deleted

## 2014-03-06 DIAGNOSIS — I480 Paroxysmal atrial fibrillation: Secondary | ICD-10-CM

## 2014-03-06 DIAGNOSIS — I4891 Unspecified atrial fibrillation: Secondary | ICD-10-CM

## 2014-03-06 LAB — POCT INR: INR: 1.5

## 2014-03-06 NOTE — Patient Instructions (Signed)
Pt was started on Eliquis  bid  for Atrial Fib on September 23.2015.    Reviewed patients medication list.  Pt is not currently on any combined P-gp and strong CYP3A4 inhibitors/inducers (ketoconazole, traconazole, ritonavir, carbamazepine, phenytoin, rifampin, St. John's wort).  Reviewed labs.  SCr , Weight 87.72kg.  Dose is appropriate based on age and weight.   Hgb and HCT   A full discussion of the nature of anticoagulants has been carried out.  A benefit/risk analysis has been presented to the patient, so that they understand the justification for choosing anticoagulation with Eliquis at this time.  The need for compliance is stressed.  Pt is aware to take the medication twice daily.  Side effects of potential bleeding are discussed, including unusual colored urine or stools, coughing up blood or coffee ground emesis, nose bleeds or serious fall or head trauma.  Discussed signs and symptoms of stroke. The patient should avoid any OTC items containing aspirin or ibuprofen.  Avoid alcohol consumption.   Call if any signs of abnormal bleeding.  Discussed financial obligations and resolved any difficulty in obtaining medication.  Next lab test test in 1 month.

## 2014-03-25 ENCOUNTER — Other Ambulatory Visit: Payer: Self-pay | Admitting: *Deleted

## 2014-03-25 MED ORDER — DILTIAZEM HCL ER COATED BEADS 120 MG PO CP24: 120.0000 mg | ORAL_CAPSULE | Freq: Every day | ORAL | Status: DC

## 2014-04-08 ENCOUNTER — Ambulatory Visit (INDEPENDENT_AMBULATORY_CARE_PROVIDER_SITE_OTHER): Payer: Medicare Other | Admitting: *Deleted

## 2014-04-08 DIAGNOSIS — I480 Paroxysmal atrial fibrillation: Secondary | ICD-10-CM

## 2014-04-08 LAB — CBC
HCT: 38.4 % — ABNORMAL LOW (ref 39.0–52.0)
HEMOGLOBIN: 12.4 g/dL — AB (ref 13.0–17.0)
MCHC: 32.3 g/dL (ref 30.0–36.0)
MCV: 98.4 fl (ref 78.0–100.0)
PLATELETS: 274 10*3/uL (ref 150.0–400.0)
RBC: 3.9 Mil/uL — ABNORMAL LOW (ref 4.22–5.81)
RDW: 15.2 % (ref 11.5–15.5)
WBC: 12.9 10*3/uL — ABNORMAL HIGH (ref 4.0–10.5)

## 2014-04-08 LAB — BASIC METABOLIC PANEL
BUN: 17 mg/dL (ref 6–23)
CHLORIDE: 94 meq/L — AB (ref 96–112)
CO2: 40 mEq/L — ABNORMAL HIGH (ref 19–32)
CREATININE: 1 mg/dL (ref 0.4–1.5)
Calcium: 8.7 mg/dL (ref 8.4–10.5)
GFR: 79.81 mL/min (ref >60.00)
GLUCOSE: 157 mg/dL — AB (ref 70–99)
Potassium: 3.8 mEq/L (ref 3.5–5.1)
Sodium: 139 mEq/L (ref 135–145)

## 2014-04-08 NOTE — Progress Notes (Signed)
Pt was started on Eliquis 5mg  bid  for  Atrial Fib on September 23,2015.    Reviewed patients medication list.  Pt is not  currently on any combined P-gp and strong CYP3A4 inhibitors/inducers (ketoconazole, traconazole, ritonavir, carbamazepine, phenytoin, rifampin, St. John's wort).  Reviewed labs.  SCr 1.0 , Weight 89.36kg,.  Dose is  appropriate based on labs  Age and  weight Hgb 12.4 and HCT 38.4  A full discussion of the nature of anticoagulants has been carried out.  A benefit/risk analysis has been presented to the patient, so that they understand the justification for choosing anticoagulation with Eliquis at this time.  The need for compliance is stressed.  Pt is aware to take the medication twice daily.  Side effects of potential bleeding are discussed, including unusual colored urine or stools, coughing up blood or coffee ground emesis, nose bleeds or serious fall or head trauma.  Discussed signs and symptoms of stroke. The patient should avoid any OTC items containing aspirin or ibuprofen.  Avoid alcohol consumption.   Call if any signs of abnormal bleeding.  Discussed financial obligations and they are not having any difficulty in obtaining medication.  Next lab test test in 6 months. Pt states is not having any problems with taking Eliquis and has not seen any sign or symptom of bleeding and no sign or symptom of stroke  Is on Esbriet 267mg  cap 3 tid and this does not have any affect with Eliquis To have cbc and bmet done today and pt aware will call with results 04/10/2014 Spoke with pt and informed that he is on the correct dose of Eliquis 5mg  bid and to continue this and made appt for recheck in  6 months and he states understanding

## 2014-04-09 ENCOUNTER — Ambulatory Visit: Payer: Medicare Other | Admitting: Cardiology

## 2014-04-10 NOTE — Progress Notes (Signed)
LVM @ 1028

## 2014-05-23 ENCOUNTER — Encounter (HOSPITAL_COMMUNITY): Payer: Self-pay | Admitting: Cardiovascular Disease

## 2014-05-28 ENCOUNTER — Ambulatory Visit: Payer: Medicare Other | Admitting: Cardiovascular Disease

## 2014-06-21 ENCOUNTER — Ambulatory Visit: Payer: Medicare Other | Admitting: Cardiovascular Disease

## 2014-07-03 ENCOUNTER — Ambulatory Visit: Payer: Medicare Other | Admitting: Cardiovascular Disease

## 2014-07-25 ENCOUNTER — Ambulatory Visit: Payer: Medicare Other | Admitting: Cardiovascular Disease

## 2014-08-08 ENCOUNTER — Ambulatory Visit (INDEPENDENT_AMBULATORY_CARE_PROVIDER_SITE_OTHER): Payer: Medicare Other | Admitting: Cardiovascular Disease

## 2014-08-08 ENCOUNTER — Encounter: Payer: Self-pay | Admitting: Cardiovascular Disease

## 2014-08-08 VITALS — BP 120/70 | HR 72 | Ht 71.0 in | Wt 197.8 lb

## 2014-08-08 DIAGNOSIS — I480 Paroxysmal atrial fibrillation: Secondary | ICD-10-CM

## 2014-08-08 DIAGNOSIS — J8410 Pulmonary fibrosis, unspecified: Secondary | ICD-10-CM

## 2014-08-08 DIAGNOSIS — I2510 Atherosclerotic heart disease of native coronary artery without angina pectoris: Secondary | ICD-10-CM

## 2014-08-08 NOTE — Progress Notes (Signed)
Cardiology Office Note   Date:  08/08/2014   ID:  Cody Olson, Cody Olson [birth year], MRN [id number]  PCP:  Pamelia Hoit, MD  Cardiologist:   Elease Hashimoto Deloris Ping, MD   Chief Complaint  Patient presents with  . Coronary Artery Disease   1. Coronary artery disease-status post PTCA and stenting of his right coronary artery 2. Hypertension 3. Pulmonary hypertension 4. Pulmonary fibrosis on CT scan 5. Hyperlipidemia 6. Hemochromatosis 7. Atrial fibrillation  History of Present Illness: Cody Olson is an elderly man with a history of coronary artery disease. He status post PTCA and stenting of his right coronary artery. He also has a history of hypertension and pulmonary hypertension. He also has Pulmonary fibrosis diagnosed on CT scan.  He's done well since I last saw him. He's not had any episodes of chest pain or shortness of breath. He has remained very active.  Nov. 12, 2014:  Cody Olson is doing well but he continues to have severe dyspnea. He has pulmonary fibrosis but the pulmonary doctors dont think it is bad enough to be causiing the dypnea. No angina. Dyspnea is always with exertion. The pain does not feel like his previous episodes of angina. He can walk on a flat road for 30 minutes, but going up hill, he is short of breath in 10 minutes.   He has had PFTs that revealed some mild pulmanry fibrosis but not severe enough to be causing these symptoms.   Sept. 10, 2015:  Cody Olson was last seen in November , 2014. We performed a cardiac catheterization in December, 2014 which revealed widely patent coronary arteries. He has been in the hospital ( [facility]) for 69 days. 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 He is now in pulmonary rehab. He has developed atrial fibrillation. [facility] Then went to [facility] [place] For 3 weeks.  Just got home 3 days ago.   He arrived in a wheel chair. He uses a walker at home. . Able to get to  the bathroom and shower by himself.    Feb. 25, 2016:   Cody Olson is a 74 y.o. male who presents for follow up of his CAD adn pulmonary diseae.  Still using a walker to get around.  Uses a cane at home.   Past Medical History  Diagnosis Date  . Coronary artery disease     stent 1996, re expansion in 2009  . Hypertension   . Pulmonary hypertension     PA pressure 41 mm Hg  . Pulmonary fibrosis   . Orthostatic hypotension     Past Surgical History  Procedure Laterality Date  . Coronary angioplasty with stent placement      stenting of RCA  . Cardiac catheterization      Est. EF of 65-70% Single-vessel coronary artery disease involving the RCA -- Normal LV systolic function  . Coronary angioplasty with stent placement      Successful PCI of the proximal RCA in-stent restenosis -- The stenosis was reduced from 80-90% down to 10%  . Back surgery      x 3  . Left heart catheterization with coronary angiogram N/A 05/24/2013    Procedure: LEFT HEART CATHETERIZATION WITH CORONARY ANGIOGRAM;  Surgeon: Alvia Grove, MD;  Location: [facility] CATH LAB;  Service: Cardiovascular;  Laterality: N/A;     Current Outpatient Prescriptions  Medication Sig Dispense Refill  . ALPRAZolam (XANAX) 0.5 MG tablet Take 0.5 mg by  mouth at bedtime as needed for sleep.     Marland Kitchen. apixaban (ELIQUIS) 5 MG TABS tablet Take 1 tablet (5 mg total) by mouth 2 (two) times daily. 180 tablet 3  . aspirin 81 MG tablet Take 1 tablet (81 mg total) by mouth daily. 30 tablet   . atorvastatin (LIPITOR) 40 MG tablet Take 0.5 tablets (20 mg total) by mouth daily.    . budesonide-formoterol (SYMBICORT) 80-4.5 MCG/ACT inhaler Inhale 1 puff into the lungs 2 (two) times daily.    . diclofenac sodium (VOLTAREN) 1 % GEL Apply to feet and ankles    . diltiazem (CARDIZEM CD) 120 MG 24 hr capsule Take 1 capsule (120 mg total) by mouth daily. 90 capsule 3  . doxazosin (CARDURA) 8 MG tablet Take 8 mg by mouth at bedtime.      .  hydrochlorothiazide 25 MG tablet Take 12.5 mg by mouth daily.      . metoprolol (LOPRESSOR) 50 MG tablet Take 0.5 tablets (25 mg total) by mouth 2 (two) times daily.    . nitroGLYCERIN (NITROSTAT) 0.4 MG SL tablet Place 0.4 mg under the tongue every 5 (five) minutes as needed for chest pain.     . pantoprazole (PROTONIX) 40 MG tablet Take 40 mg by mouth.    . Pirfenidone (ESBRIET) 267 MG CAPS Takes 3 cap tid    . potassium chloride SA (K-DUR,KLOR-CON) 20 MEQ tablet Take 1 tablet (20 mEq total) by mouth daily.    . predniSONE (DELTASONE) 10 MG tablet Take 10 mg by mouth.     No current facility-administered medications for this visit.    Allergies:   Ace inhibitors; Fluticasone furoate-vilanterol; and Losartan    Social History:  The patient  reports that he quit smoking about 19 years ago. His smoking use included Cigarettes. He has a 50 pack-year smoking history. He does not have any smokeless tobacco history on file. He reports that he does not drink alcohol or use illicit drugs.   Family History:  The patient's family history includes Heart disease in his mother.    ROS:  Please see the history of present illness.    Review of Systems: Constitutional:  denies fever, chills, diaphoresis, appetite change and fatigue.  HEENT: denies photophobia, eye pain, redness, hearing loss, ear pain, congestion, sore throat, rhinorrhea, sneezing, neck pain, neck stiffness and tinnitus.  Respiratory: admits to SOB, DOE, cough, chest tightness, and wheezing.  Cardiovascular: denies chest pain, palpitations and leg swelling.  Gastrointestinal: denies nausea, vomiting, abdominal pain, diarrhea, constipation, blood in stool.  Genitourinary: denies dysuria, urgency, frequency, hematuria, flank pain and difficulty urinating.  Musculoskeletal: denies  myalgias, back pain, joint swelling, arthralgias and gait problem.   Skin: denies pallor, rash and wound.  Neurological: denies dizziness, seizures, syncope,  weakness, light-headedness, numbness and headaches.   Hematological: denies adenopathy, easy bruising, personal or family bleeding history.  Psychiatric/ Behavioral: denies suicidal ideation, mood changes, confusion, nervousness, sleep disturbance and agitation.       All other systems are reviewed and negative.    PHYSICAL EXAM: VS:  BP 120/70 mmHg  Pulse 72  Ht 5\' 11"  (1.803 m)  Wt 197 lb 12.8 oz (89.721 kg)  BMI 27.60 kg/m2  SpO2 98% , BMI Body mass index is 27.6 kg/(m^2). GEN: Well nourished, well developed, in no acute distress HEENT: normal Neck: no JVD, carotid bruits, or masses Cardiac: RRR; no murmurs, rubs, or gallops,no edema  Respiratory:  Bilateral coarse rales c/w pulmonary fibrosis  GI: soft, nontender, nondistended, + BS MS: no deformity or atrophy Skin: warm and dry, no rash Neuro:  Strength and sensation are intact Psych: normal   EKG:  EKG is not ordered today.    Recent Labs: 04/08/2014: BUN 17; Creatinine 1.0; Hemoglobin 12.4*; Platelets 274.0; Potassium 3.8; Sodium 139    Lipid Panel No results found for: CHOL, TRIG, HDL, CHOLHDL, VLDL, LDLCALC, LDLDIRECT    Wt Readings from Last 3 Encounters:  08/08/14 197 lb 12.8 oz (89.721 kg)  05/24/13 212 lb (96.163 kg)  05/04/13 210 lb (95.255 kg)      Other studies Reviewed: Additional studies/ records that were reviewed today include: . Review of the above records demonstrates:    ASSESSMENT AND PLAN:  1. Coronary artery disease-status post PTCA and stenting of his right coronary artery -  He's not having any episodes of angina. Continue current medications.  2. Hypertension-  His blood pressure seems to be well-controlled. Continue current medications. 3. Pulmonary hypertension -  His pulmonary hypertension is secondary to his polio fibrosis. 4. Pulmonary fibrosis on CT scan-  Followed by his general medical doctor. 5. Hyperlipidemia -  Stable. 6. Hemochromatosis 7. Atrial fibrillation -  He  has maintained normal sinus rhythm.   Current medicines are reviewed at length with the patient today.  The patient does not have concerns regarding medicines.  The following changes have been made:  no change   Disposition:   FU with me in 6 months      Signed, Silvio Sausedo, Deloris Ping, MD  08/08/2014 11:30 AM    [facility] [address] [place], [place], [place]  [address] Phone: [phone]; Fax: [phone]

## 2014-08-08 NOTE — Patient Instructions (Signed)
Your physician recommends that you continue on your current medications as directed. Please refer to the Current Medication list given to you today.  Your physician wants you to follow-up in: 6 month with Dr. Elease HashimotoNahser.  You will receive a reminder letter in the mail two months in advance. If you don't receive a letter, please call our office to schedule the follow-up appointment.

## 2015-02-05 ENCOUNTER — Ambulatory Visit: Payer: Medicare Other | Admitting: Cardiovascular Disease

## 2015-02-28 ENCOUNTER — Other Ambulatory Visit: Payer: Self-pay

## 2015-02-28 MED ORDER — APIXABAN 5 MG PO TABS: 5.0000 mg | ORAL_TABLET | Freq: Two times a day (BID) | ORAL | Status: DC

## 2015-02-28 NOTE — Telephone Encounter (Signed)
Vesta Mixer, MD at 08/08/2014 11:30 AM  apixaban (ELIQUIS) 5 MG TABS tabletTake 1 tablet (5 mg total) by mouth 2 (two) times daily Current medicines are reviewed at length with the patient today. The patient does not have concerns regarding medicines.  The following changes have been made: no change

## 2015-03-11 ENCOUNTER — Ambulatory Visit (INDEPENDENT_AMBULATORY_CARE_PROVIDER_SITE_OTHER): Payer: Medicare Other | Admitting: Cardiovascular Disease

## 2015-03-11 ENCOUNTER — Encounter: Payer: Self-pay | Admitting: Cardiovascular Disease

## 2015-03-11 VITALS — BP 112/80 | HR 76 | Ht 71.0 in | Wt 196.5 lb

## 2015-03-11 DIAGNOSIS — E7800 Pure hypercholesterolemia, unspecified: Secondary | ICD-10-CM

## 2015-03-11 DIAGNOSIS — I480 Paroxysmal atrial fibrillation: Secondary | ICD-10-CM

## 2015-03-11 DIAGNOSIS — I10 Essential (primary) hypertension: Secondary | ICD-10-CM

## 2015-03-11 DIAGNOSIS — I2510 Atherosclerotic heart disease of native coronary artery without angina pectoris: Secondary | ICD-10-CM | POA: Diagnosis not present

## 2015-03-11 MED ORDER — NITROGLYCERIN 0.4 MG SL SUBL: 0.4000 mg | SUBLINGUAL_TABLET | SUBLINGUAL | Status: AC | PRN

## 2015-03-11 MED ORDER — DILTIAZEM HCL ER COATED BEADS 120 MG PO CP24: 120.0000 mg | ORAL_CAPSULE | Freq: Every day | ORAL | Status: AC

## 2015-03-11 MED ORDER — APIXABAN 5 MG PO TABS: 5.0000 mg | ORAL_TABLET | Freq: Two times a day (BID) | ORAL | Status: AC

## 2015-03-11 NOTE — Progress Notes (Signed)
Cardiology Office Note   Date:  03/11/2015   ID:  Cody Olson, Cody Olson 1941/03/16, MRN [id number]  PCP:  Cody Hoit, MD  Cardiologist:   Cody Mixer, MD   Chief Complaint  Patient presents with  . Coronary Artery Disease   1. Coronary artery disease-status post PTCA and stenting of his right coronary artery 2. Hypertension 3. Pulmonary hypertension 4. Pulmonary fibrosis on CT scan 5. Hyperlipidemia 6. Hemochromatosis 7. Atrial fibrillation  History of Present Illness: [name] is an elderly man with a history of coronary artery disease. He status post PTCA and stenting of his right coronary artery. He also has a history of hypertension and pulmonary hypertension. He also has Pulmonary fibrosis diagnosed on CT scan.  He's done well since I last saw him. He's not had any episodes of chest pain or shortness of breath. He has remained very active.  Nov. 12, 2014:  Cody Olson is doing well but he continues to have severe dyspnea. He has pulmonary fibrosis but the pulmonary doctors dont think it is bad enough to be causiing the dypnea. No angina. Dyspnea is always with exertion. The pain does not feel like his previous episodes of angina. He can walk on a flat road for 30 minutes, but going up hill, he is short of breath in 10 minutes.   He has had PFTs that revealed some mild pulmanry fibrosis but not severe enough to be causing these symptoms.   Sept. 10, 2015:  Cody Olson was last seen in November , 2014. We performed a cardiac catheterization in December, 2014 which revealed widely patent coronary arteries. He has been in the hospital ( [facility]) for 69 days. 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 He is now in pulmonary rehab. He has developed atrial fibrillation. [facility] Then went to [facility] [place] For 3 weeks.  Just got home 3 days ago.   He arrived in a wheel chair. He uses a walker at home. . Able to get to  the bathroom and shower by himself.    Feb. 25, 2016:   Cody Olson is a 74 y.o. male who presents for follow up of his CAD adn pulmonary diseae.  Still using a walker to get around.  Uses a cane at home.   Sept. 27.  2016:  Doing well.   Walking with a walker.  Uses a cane at home . Marland Kitchen   In atrial fib No CP or dyspnea    Past Medical History  Diagnosis Date  . Coronary artery disease     stent 1996, re expansion in 2009  . Hypertension   . Pulmonary hypertension     PA pressure 41 mm Hg  . Pulmonary fibrosis   . Orthostatic hypotension     Past Surgical History  Procedure Laterality Date  . Coronary angioplasty with stent placement      stenting of RCA  . Cardiac catheterization      Est. EF of 65-70% Single-vessel coronary artery disease involving the RCA -- Normal LV systolic function  . Coronary angioplasty with stent placement      Successful PCI of the proximal RCA in-stent restenosis -- The stenosis was reduced from 80-90% down to 10%  . Back surgery      x 3  . Left heart catheterization with coronary angiogram N/A 05/24/2013    Procedure: LEFT HEART CATHETERIZATION WITH CORONARY ANGIOGRAM;  Surgeon: Cody Grove, MD;  Location: [facility] CATH LAB;  Service: Cardiovascular;  Laterality: N/A;     Current Outpatient Prescriptions  Medication Sig Dispense Refill  . ALPRAZolam (XANAX) 0.5 MG tablet Take 0.5 mg by mouth at bedtime as needed for sleep.     Marland Kitchen apixaban (ELIQUIS) 5 MG TABS tablet Take 1 tablet (5 mg total) by mouth 2 (two) times daily. 180 tablet 1  . aspirin 81 MG tablet Take 1 tablet (81 mg total) by mouth daily. 30 tablet   . atorvastatin (LIPITOR) 40 MG tablet Take 0.5 tablets (20 mg total) by mouth daily.    . budesonide-formoterol (SYMBICORT) 80-4.5 MCG/ACT inhaler Inhale 1 puff into the lungs 2 (two) times daily.    . diclofenac sodium (VOLTAREN) 1 % GEL Apply 2 g topically 4 (four) times daily. Apply to feet and ankles    . diltiazem  (CARDIZEM CD) 120 MG 24 hr capsule Take 1 capsule (120 mg total) by mouth daily. 90 capsule 3  . doxazosin (CARDURA) 8 MG tablet Take 8 mg by mouth at bedtime.      . hydrochlorothiazide 25 MG tablet Take 12.5 mg by mouth daily.      . metoprolol (LOPRESSOR) 50 MG tablet Take 0.5 tablets (25 mg total) by mouth 2 (two) times daily.    . nitroGLYCERIN (NITROSTAT) 0.4 MG SL tablet Place 0.4 mg under the tongue every 5 (five) minutes as needed for chest pain.     . pantoprazole (PROTONIX) 40 MG tablet Take 40 mg by mouth daily.     . Pirfenidone (ESBRIET) 267 MG CAPS Takes 3 cap tid    . potassium chloride SA (K-DUR,KLOR-CON) 20 MEQ tablet Take 1 tablet (20 mEq total) by mouth daily.    . predniSONE (DELTASONE) 10 MG tablet Take 10 mg by mouth daily with breakfast.      No current facility-administered medications for this visit.    Allergies:   Fluticasone furoate-vilanterol; Ace inhibitors; and Losartan    Social History:  The patient  reports that he quit smoking about 19 years ago. His smoking use included Cigarettes. He has a 50 pack-year smoking history. He does not have any smokeless tobacco history on file. He reports that he does not drink alcohol or use illicit drugs.   Family History:  The patient's family history includes Cancer (age of onset: 16) in his father; Cancer - Other (age of onset: 47) in his brother; Heart disease in his mother; Other in his daughter, daughter, daughter, and son; Other (age of onset: 33) in his brother; Stroke in his mother.    ROS:  Please see the history of present illness.    Review of Systems: Constitutional:  denies fever, chills, diaphoresis, appetite change and fatigue.  HEENT: denies photophobia, eye pain, redness, hearing loss, ear pain, congestion, sore throat, rhinorrhea, sneezing, neck pain, neck stiffness and tinnitus.  Respiratory: admits to SOB, DOE, cough, chest tightness, and wheezing.  Cardiovascular: denies chest pain, palpitations  and leg swelling.  Gastrointestinal: denies nausea, vomiting, abdominal pain, diarrhea, constipation, blood in stool.  Genitourinary: denies dysuria, urgency, frequency, hematuria, flank pain and difficulty urinating.  Musculoskeletal: denies  myalgias, back pain, joint swelling, arthralgias and gait problem.   Skin: denies pallor, rash and wound.  Neurological: denies dizziness, seizures, syncope, weakness, light-headedness, numbness and headaches.   Hematological: denies adenopathy, easy bruising, personal or family bleeding history.  Psychiatric/ Behavioral: denies suicidal ideation, mood changes, confusion, nervousness, sleep disturbance and agitation.  All other systems are reviewed and negative.    PHYSICAL EXAM: VS:  BP 112/80 mmHg  Pulse 76  Ht  (1.803 m)  Wt 89.132 kg (196 lb 8 oz)  BMI 27.42 kg/m2 , BMI Body mass index is 27.42 kg/(m^2). GEN: Well nourished, well developed, in no acute distress HEENT: normal Neck: no JVD, carotid bruits, or masses Cardiac: RRR; no murmurs, rubs, or gallops,no edema  Respiratory:  Bilateral coarse rales c/w pulmonary fibrosis  GI: soft, nontender, nondistended, + BS MS: no deformity or atrophy Skin: warm and dry, no rash Neuro:  Strength and sensation are intact Psych: normal   EKG:  EKG is ordered today. Atrial fib with rate of 76.    NS ST abn.    Recent Labs: 04/08/2014: BUN 17; Creatinine, Ser 1.0; Hemoglobin 12.4*; Platelets 274.0; Potassium 3.8; Sodium 139   Lipid Panel No results found for: CHOL, TRIG, HDL, CHOLHDL, VLDL, LDLCALC, LDLDIRECT    Wt Readings from Last 3 Encounters:  03/11/15 89.132 kg (196 lb 8 oz)  08/08/14 89.721 kg (197 lb 12.8 oz)  05/24/13 96.163 kg (212 lb)      Other studies Reviewed: Additional studies/ records that were reviewed today include: . Review of the above records demonstrates:    ASSESSMENT AND PLAN:  1. Coronary artery disease-status post PTCA and stenting of his  right coronary artery -  He's not having any episodes of angina. Continue current medications.  2. Hypertension-  His blood pressure seems to be well-controlled. Continue current medications. 3. Pulmonary hypertension -  His pulmonary hypertension is secondary to his pulmonary fibrosis. 4. Pulmonary fibrosis on CT scan-  Followed by his general medical doctor. 5. Hyperlipidemia -  Stable. 6. Hemochromatosis 7. Atrial fibrillation -  Is back in A-fib .  On Eliquis   Current medicines are reviewed at length with the patient today.  The patient does not have concerns regarding medicines.  The following changes have been made:  no change   Disposition:   FU with me in 6 months      Signed, Nahser, Deloris Ping, MD  03/11/2015 2:15 PM    [facility] [address] [place], [place], [place]  [address] Phone: [phone]; Fax: [phone]

## 2015-03-11 NOTE — Patient Instructions (Signed)
Medication Instructions:  Your physician recommends that you continue on your current medications as directed. Please refer to the Current Medication list given to you today.   Labwork: None Ordered   Testing/Procedures: None Ordered   Follow-Up: Your physician wants you to follow-up in: 6 months with Dr. Nahser.  You will receive a reminder letter in the mail two months in advance. If you don't receive a letter, please call our office to schedule the follow-up appointment.     

## 2015-09-09 ENCOUNTER — Ambulatory Visit: Payer: Medicare Other | Admitting: Cardiovascular Disease

## 2015-10-02 ENCOUNTER — Ambulatory Visit: Payer: Medicare Other | Admitting: Cardiovascular Disease

## 2015-10-13 DEATH — deceased
